# Patient Record
Sex: Female | Born: 1980 | Race: Black or African American | Hispanic: No | Marital: Married | Smoking: Never smoker
Health system: Southern US, Community
[De-identification: ages and names within clinical notes are randomized; demographics above are authoritative.]

## PROBLEM LIST (undated history)

## (undated) DIAGNOSIS — I471 Supraventricular tachycardia, unspecified: Secondary | ICD-10-CM

## (undated) DIAGNOSIS — G43909 Migraine, unspecified, not intractable, without status migrainosus: Secondary | ICD-10-CM

## (undated) DIAGNOSIS — E119 Type 2 diabetes mellitus without complications: Secondary | ICD-10-CM

## (undated) DIAGNOSIS — I10 Essential (primary) hypertension: Secondary | ICD-10-CM

## (undated) HISTORY — PX: HERNIA REPAIR: SHX51

## (undated) HISTORY — DX: Supraventricular tachycardia: I47.1

## (undated) HISTORY — DX: Supraventricular tachycardia, unspecified: I47.10

## (undated) HISTORY — PX: TUBAL LIGATION: SHX77

---

## 2015-10-15 ENCOUNTER — Encounter (HOSPITAL_COMMUNITY): Payer: Self-pay | Admitting: *Deleted

## 2015-10-15 DIAGNOSIS — R110 Nausea: Secondary | ICD-10-CM | POA: Diagnosis not present

## 2015-10-15 DIAGNOSIS — I10 Essential (primary) hypertension: Secondary | ICD-10-CM | POA: Diagnosis not present

## 2015-10-15 DIAGNOSIS — E119 Type 2 diabetes mellitus without complications: Secondary | ICD-10-CM | POA: Diagnosis not present

## 2015-10-15 DIAGNOSIS — Z5321 Procedure and treatment not carried out due to patient leaving prior to being seen by health care provider: Secondary | ICD-10-CM | POA: Insufficient documentation

## 2015-10-15 DIAGNOSIS — R109 Unspecified abdominal pain: Secondary | ICD-10-CM | POA: Diagnosis not present

## 2015-10-15 LAB — URINALYSIS, ROUTINE W REFLEX MICROSCOPIC
Glucose, UA: NEGATIVE mg/dL
Ketones, ur: 15 mg/dL — AB
Nitrite: POSITIVE — AB
Protein, ur: 30 mg/dL — AB
Specific Gravity, Urine: 1.022 (ref 1.005–1.030)
pH: 6 (ref 5.0–8.0)

## 2015-10-15 LAB — COMPREHENSIVE METABOLIC PANEL
ALT: 10 U/L — ABNORMAL LOW (ref 14–54)
AST: 13 U/L — ABNORMAL LOW (ref 15–41)
Albumin: 3.9 g/dL (ref 3.5–5.0)
Alkaline Phosphatase: 80 U/L (ref 38–126)
Anion gap: 10 (ref 5–15)
BUN: 6 mg/dL (ref 6–20)
CO2: 25 mmol/L (ref 22–32)
Calcium: 9.2 mg/dL (ref 8.9–10.3)
Chloride: 102 mmol/L (ref 101–111)
Creatinine, Ser: 0.96 mg/dL (ref 0.44–1.00)
GFR calc Af Amer: >60 mL/min (ref >60)
GFR calc non Af Amer: >60 mL/min (ref >60)
Glucose, Bld: 127 mg/dL — ABNORMAL HIGH (ref 65–99)
Potassium: 3.4 mmol/L — ABNORMAL LOW (ref 3.5–5.1)
Sodium: 137 mmol/L (ref 135–145)
Total Bilirubin: 1.1 mg/dL (ref 0.3–1.2)
Total Protein: 7.2 g/dL (ref 6.5–8.1)

## 2015-10-15 LAB — CBC
HCT: 38.3 % (ref 36.0–46.0)
Hemoglobin: 12.4 g/dL (ref 12.0–15.0)
MCH: 27.9 pg (ref 26.0–34.0)
MCHC: 32.4 g/dL (ref 30.0–36.0)
MCV: 86.3 fL (ref 78.0–100.0)
Platelets: 227 10*3/uL (ref 150–400)
RBC: 4.44 MIL/uL (ref 3.87–5.11)
RDW: 12.4 % (ref 11.5–15.5)
WBC: 10.2 10*3/uL (ref 4.0–10.5)

## 2015-10-15 LAB — URINE MICROSCOPIC-ADD ON

## 2015-10-15 LAB — LIPASE, BLOOD: Lipase: 20 U/L (ref 11–51)

## 2015-10-15 LAB — POC URINE PREG, ED: Preg Test, Ur: NEGATIVE

## 2015-10-15 MED ORDER — OXYCODONE-ACETAMINOPHEN 5-325 MG PO TABS
1.0000 | ORAL_TABLET | Freq: Once | ORAL | Status: AC
  Administered 2015-10-15: 1 via ORAL

## 2015-10-15 MED ORDER — ONDANSETRON 4 MG PO TBDP
4.0000 mg | ORAL_TABLET | Freq: Once | ORAL | Status: AC | PRN
  Administered 2015-10-15: 4 mg via ORAL

## 2015-10-15 MED ORDER — ONDANSETRON 4 MG PO TBDP
ORAL_TABLET | ORAL | Status: AC
  Filled 2015-10-15: qty 1

## 2015-10-15 MED ORDER — OXYCODONE-ACETAMINOPHEN 5-325 MG PO TABS
ORAL_TABLET | ORAL | Status: AC
  Filled 2015-10-15: qty 1

## 2015-10-15 NOTE — ED Triage Notes (Signed)
Pt c/o right flank pain, abdominal pain, n/v for the past couple of days. Pt states movement increases flank pain.

## 2015-10-16 ENCOUNTER — Encounter (HOSPITAL_COMMUNITY): Payer: Self-pay | Admitting: Emergency Medicine

## 2015-10-16 ENCOUNTER — Emergency Department (HOSPITAL_COMMUNITY)
Admission: EM | Admit: 2015-10-16 | Discharge: 2015-10-16 | Disposition: A | Discharge status: Home / Self Care | Payer: Medicaid Other | Attending: Emergency Medicine | Admitting: Emergency Medicine

## 2015-10-16 ENCOUNTER — Emergency Department (HOSPITAL_COMMUNITY)
Admission: EM | Admit: 2015-10-16 | Discharge: 2015-10-17 | Disposition: A | Discharge status: Home / Self Care | Payer: Medicaid Other | Attending: Emergency Medicine | Admitting: Emergency Medicine

## 2015-10-16 DIAGNOSIS — E119 Type 2 diabetes mellitus without complications: Secondary | ICD-10-CM | POA: Diagnosis not present

## 2015-10-16 DIAGNOSIS — I10 Essential (primary) hypertension: Secondary | ICD-10-CM | POA: Insufficient documentation

## 2015-10-16 DIAGNOSIS — N390 Urinary tract infection, site not specified: Secondary | ICD-10-CM | POA: Insufficient documentation

## 2015-10-16 DIAGNOSIS — R1031 Right lower quadrant pain: Secondary | ICD-10-CM | POA: Diagnosis present

## 2015-10-16 HISTORY — DX: Type 2 diabetes mellitus without complications: E11.9

## 2015-10-16 HISTORY — DX: Essential (primary) hypertension: I10

## 2015-10-16 LAB — URINE MICROSCOPIC-ADD ON

## 2015-10-16 LAB — URINALYSIS, ROUTINE W REFLEX MICROSCOPIC
Glucose, UA: NEGATIVE mg/dL
Ketones, ur: 40 mg/dL — AB
Nitrite: POSITIVE — AB
Protein, ur: NEGATIVE mg/dL
Specific Gravity, Urine: 1.023 (ref 1.005–1.030)
pH: 6 (ref 5.0–8.0)

## 2015-10-16 LAB — CBC WITH DIFFERENTIAL/PLATELET
Basophils Absolute: 0 10*3/uL (ref 0.0–0.1)
Basophils Relative: 0 %
Eosinophils Absolute: 0.1 10*3/uL (ref 0.0–0.7)
Eosinophils Relative: 1 %
HCT: 38.1 % (ref 36.0–46.0)
Hemoglobin: 12.2 g/dL (ref 12.0–15.0)
Lymphocytes Relative: 31 %
Lymphs Abs: 2.4 10*3/uL (ref 0.7–4.0)
MCH: 27.9 pg (ref 26.0–34.0)
MCHC: 32 g/dL (ref 30.0–36.0)
MCV: 87.2 fL (ref 78.0–100.0)
Monocytes Absolute: 1 10*3/uL (ref 0.1–1.0)
Monocytes Relative: 13 %
Neutro Abs: 4.2 10*3/uL (ref 1.7–7.7)
Neutrophils Relative %: 55 %
Platelets: 251 10*3/uL (ref 150–400)
RBC: 4.37 MIL/uL (ref 3.87–5.11)
RDW: 12.2 % (ref 11.5–15.5)
WBC: 7.7 10*3/uL (ref 4.0–10.5)

## 2015-10-16 LAB — BASIC METABOLIC PANEL
Anion gap: 10 (ref 5–15)
BUN: 7 mg/dL (ref 6–20)
CO2: 27 mmol/L (ref 22–32)
Calcium: 9 mg/dL (ref 8.9–10.3)
Chloride: 102 mmol/L (ref 101–111)
Creatinine, Ser: 0.86 mg/dL (ref 0.44–1.00)
GFR calc Af Amer: >60 mL/min (ref >60)
GFR calc non Af Amer: >60 mL/min (ref >60)
Glucose, Bld: 107 mg/dL — ABNORMAL HIGH (ref 65–99)
Potassium: 3.5 mmol/L (ref 3.5–5.1)
Sodium: 139 mmol/L (ref 135–145)

## 2015-10-16 LAB — POC URINE PREG, ED: Preg Test, Ur: NEGATIVE

## 2015-10-16 MED ORDER — ONDANSETRON HCL 4 MG/2ML IJ SOLN: 4.0000 mg | Freq: Once | INTRAMUSCULAR | Status: DC

## 2015-10-16 MED ORDER — DEXTROSE 5 % IV SOLN: 1.0000 g | Freq: Once | INTRAVENOUS | Status: DC

## 2015-10-16 MED ORDER — SODIUM CHLORIDE 0.9 % IV BOLUS (SEPSIS): 1000.0000 mL | Freq: Once | INTRAVENOUS | Status: DC

## 2015-10-16 MED ORDER — MORPHINE SULFATE (PF) 4 MG/ML IV SOLN: 4.0000 mg | Freq: Once | INTRAVENOUS | Status: DC

## 2015-10-16 NOTE — ED Triage Notes (Signed)
Pt. reports right lower back pain with concentrated " orange" urine onset 3 days ago , denies dysuria or fever , pt. added headache onset today .

## 2015-10-16 NOTE — ED Provider Notes (Signed)
[facility]-EMERGENCY DEPT Provider Note   CSN: [id number] Arrival date & time: 10/16/15  2039     History   Chief Complaint Chief Complaint  Patient presents with  . Back Pain  . Headache    HPI Paula SeltzerHillary Robertson is a 35 y.o. female.  HPI   35 year old female with history of diabetes and hypertension presenting with complaint of flank pain and low back pain. Patient report for the past 5 days she has had pain to her right flank and low back. Pain initially started in her right lower abdomen but now traveled towards her low back area and she described pain as a achy sensation, persistent, nothing seems to make it better or worse. She endorse nausea, occasional vomiting and decrease in appetite. She endorse chills without fever. She endorses having mild frontal headache with body aches. She has been taking Tylenol with some improvement. Her last menstrual period was last month. She noticed that her urinalysis is darker than usual. She denies active fever, neck stiffness, chest pain, shortness of breath, productive cough, dysuria, hematuria, vaginal bleeding, vaginal discharge, or rash. Denies any pain with sexual activities and denies any change in sexual partner. No prior history of STD.  Past Medical History:  Diagnosis Date  . Diabetes mellitus without complication (HCC)   . Hypertension     There are no active problems to display for this patient.   Past Surgical History:  Procedure Laterality Date  . HERNIA REPAIR    . TUBAL LIGATION      OB History    No data available       Home Medications    Prior to Admission medications   Not on File    Family History No family history on file.  Social History Social History  Substance Use Topics  . Smoking status: Never Smoker  . Smokeless tobacco: Never Used  . Alcohol use Yes     Allergies   Naproxen   Review of Systems Review of Systems  All other systems reviewed and are negative.    Physical Exam Updated  Vital Signs BP 159/87 (BP Location: Right Arm)   Pulse 80   Temp 98.4 F (36.9 C) (Oral)   Resp 18   LMP 09/24/2015   SpO2 100%   Physical Exam  Constitutional: She appears well-developed and well-nourished. No distress.  HENT:  Head: Atraumatic.  Eyes: Conjunctivae are normal.  Neck: Neck supple.  Cardiovascular: Normal rate and regular rhythm.   Pulmonary/Chest: Effort normal and breath sounds normal. No respiratory distress. She has no wheezes. She exhibits no tenderness.  Abdominal: Soft. She exhibits no distension and no mass. There is no tenderness. There is no rebound and no guarding.  Genitourinary:  Genitourinary Comments: Tenderness to lower back and right flank on percussion  Neurological: She is alert.  Skin: No rash noted.  Psychiatric: She has a normal mood and affect.  Nursing note and vitals reviewed.    ED Treatments / Results  Labs (all labs ordered are listed, but only abnormal results are displayed) Labs Reviewed  URINALYSIS, ROUTINE W REFLEX MICROSCOPIC (NOT AT [facility]) - Abnormal; Notable for the following:       Result Value   Color, Urine AMBER (*)    APPearance CLOUDY (*)    Hgb urine dipstick MODERATE (*)    Bilirubin Urine SMALL (*)    Ketones, ur 40 (*)    Nitrite POSITIVE (*)    Leukocytes, UA LARGE (*)    All  other components within normal limits  BASIC METABOLIC PANEL - Abnormal; Notable for the following:    Glucose, Bld 107 (*)    All other components within normal limits  URINE MICROSCOPIC-ADD ON - Abnormal; Notable for the following:    Squamous Epithelial / LPF 0-5 (*)    Bacteria, UA MANY (*)    All other components within normal limits  CBC WITH DIFFERENTIAL/PLATELET  POC URINE PREG, ED    EKG  EKG Interpretation None       Radiology No results found.  Procedures Procedures (including critical care time)  Medications Ordered in ED Medications  cefTRIAXone (ROCEPHIN) 1 g in dextrose 5 % 50 mL IVPB (not administered)    sodium chloride 0.9 % bolus 1,000 mL (not administered)  morphine 4 MG/ML injection 4 mg (not administered)  ondansetron (ZOFRAN) injection 4 mg (not administered)     Initial Impression / Assessment and Plan / ED Course  I have reviewed the triage vital signs and the nursing notes.  Pertinent labs & imaging results that were available during my care of the patient were reviewed by me and considered in my medical decision making (see chart for details).  Clinical Course    BP 137/95 (BP Location: Right Arm)   Pulse 86   Temp 99 F (37.2 C) (Oral)   Resp 18   LMP 09/24/2015   SpO2 100%    Final Clinical Impressions(s) / ED Diagnoses   Final diagnoses:  UTI (lower urinary tract infection)    New Prescriptions New Prescriptions   CEPHALEXIN (KEFLEX) 500 MG CAPSULE    Take 1 capsule (500 mg total) by mouth 3 (three) times daily.   PROMETHAZINE (PHENERGAN) 25 MG TABLET    Take 1 tablet (25 mg total) by mouth every 6 (six) hours as needed for nausea.   11:30 PM Patient here with right flank and low back pain without any complaints of dysuria. She reports pain initially started in her low abdomen but has since resolved. She has a benign abdomen on exam without any reproducible pain to her abdomen but does have pain to her lower back. She denies any new sexual partner. She denies prior history of kidney stone. She is afebrile, vital signs stable. UA shows signs concerning for urinary tract infection. I offer patient a pelvic examination to rule out STD however patient declined. Given no abdominal pain and normal white count I have low suspicion for appendicitis causing her symptoms. She appears comfortable does not appears to be a patient with kidney stone. We'll start Rocephin and IVF.    12:14 AM Pt did not have any strong vaginal odor and she denies having vaginal discomfort.  Clue cells are present but I do not think she is having BV.  She will be discharge with keflex as  treatment for UTI.  Return precaution discussed.     Fayrene Helper, PA-C 10/17/15 0016    Gilda Crease, MD 10/17/15 [phone]

## 2015-10-16 NOTE — ED Notes (Signed)
EDP at bedside  

## 2015-10-17 MED ORDER — PROMETHAZINE HCL 25 MG PO TABS: 25.0000 mg | ORAL_TABLET | Freq: Four times a day (QID) | ORAL | 0 refills | Status: DC | PRN

## 2015-10-17 MED ORDER — LIDOCAINE HCL (PF) 1 % IJ SOLN
INTRAMUSCULAR | Status: AC
  Administered 2015-10-17: 2.1 mL
  Filled 2015-10-17: qty 5

## 2015-10-17 MED ORDER — CEPHALEXIN 500 MG PO CAPS: 500.0000 mg | ORAL_CAPSULE | Freq: Three times a day (TID) | ORAL | 0 refills | Status: DC

## 2015-10-17 MED ORDER — CEFTRIAXONE SODIUM 1 G IJ SOLR
1.0000 g | Freq: Once | INTRAMUSCULAR | Status: AC
  Administered 2015-10-17: 1 g via INTRAMUSCULAR
  Filled 2015-10-17: qty 10

## 2015-10-17 NOTE — ED Notes (Signed)
Pt departed in NAD, refused use of wheelchair.  

## 2015-10-18 LAB — URINE CULTURE: Culture: >=100000 — AB

## 2015-10-19 ENCOUNTER — Telehealth (HOSPITAL_BASED_OUTPATIENT_CLINIC_OR_DEPARTMENT_OTHER): Payer: Self-pay

## 2015-10-19 NOTE — Telephone Encounter (Signed)
Post ED Visit - Positive Culture Follow-up  Culture report reviewed by antimicrobial stewardship pharmacist:  []  Paula Robertson, Pharm.D. []  Paula Robertson, Pharm.D., BCPS [x]  Paula Robertson, Pharm.D. []  Paula Robertson, Pharm.D., BCPS []  Paula Robertson, [address] BCPS, AAHIVP []  Paula Robertson, Pharm.D., BCPS, AAHIVP []  Paula Robertson, Pharm.D. []  Paula Robertson, [address]Pharm[address]  Positive urine culture and no further patient follow-up is required at this time.  Paula Robertson, Paula Robertson 10/19/2015, 8:59 AM

## 2017-05-25 ENCOUNTER — Emergency Department (HOSPITAL_COMMUNITY): Payer: Self-pay

## 2017-05-25 ENCOUNTER — Encounter (HOSPITAL_COMMUNITY): Payer: Self-pay | Admitting: Emergency Medicine

## 2017-05-25 ENCOUNTER — Other Ambulatory Visit: Payer: Self-pay

## 2017-05-25 ENCOUNTER — Emergency Department (HOSPITAL_COMMUNITY)
Admission: EM | Admit: 2017-05-25 | Discharge: 2017-05-25 | Disposition: A | Discharge status: Home / Self Care | Payer: Self-pay | Attending: Emergency Medicine | Admitting: Emergency Medicine

## 2017-05-25 DIAGNOSIS — E119 Type 2 diabetes mellitus without complications: Secondary | ICD-10-CM | POA: Insufficient documentation

## 2017-05-25 DIAGNOSIS — B9789 Other viral agents as the cause of diseases classified elsewhere: Secondary | ICD-10-CM

## 2017-05-25 DIAGNOSIS — I10 Essential (primary) hypertension: Secondary | ICD-10-CM | POA: Insufficient documentation

## 2017-05-25 DIAGNOSIS — J069 Acute upper respiratory infection, unspecified: Secondary | ICD-10-CM | POA: Insufficient documentation

## 2017-05-25 DIAGNOSIS — Z79899 Other long term (current) drug therapy: Secondary | ICD-10-CM | POA: Insufficient documentation

## 2017-05-25 MED ORDER — GUAIFENESIN-CODEINE 100-10 MG/5ML PO SOLN: 5.0000 mL | Freq: Three times a day (TID) | ORAL | 0 refills | Status: DC | PRN

## 2017-05-25 MED ORDER — FLUTICASONE PROPIONATE 50 MCG/ACT NA SUSP: 1.0000 | Freq: Every day | NASAL | 2 refills | Status: DC

## 2017-05-25 MED ORDER — ACETAMINOPHEN 325 MG PO TABS
650.0000 mg | ORAL_TABLET | Freq: Once | ORAL | Status: AC
  Administered 2017-05-25: 650 mg via ORAL
  Filled 2017-05-25: qty 2

## 2017-05-25 MED ORDER — ALBUTEROL SULFATE HFA 108 (90 BASE) MCG/ACT IN AERS
1.0000 | INHALATION_SPRAY | Freq: Once | RESPIRATORY_TRACT | Status: AC
  Administered 2017-05-25: 1 via RESPIRATORY_TRACT
  Filled 2017-05-25: qty 6.7

## 2017-05-25 MED ORDER — BENZONATATE 100 MG PO CAPS: 200.0000 mg | ORAL_CAPSULE | Freq: Three times a day (TID) | ORAL | 0 refills | Status: DC

## 2017-05-25 MED ORDER — DEXAMETHASONE SODIUM PHOSPHATE 10 MG/ML IJ SOLN
10.0000 mg | Freq: Once | INTRAMUSCULAR | Status: AC
  Administered 2017-05-25: 10 mg via INTRAMUSCULAR
  Filled 2017-05-25: qty 1

## 2017-05-25 MED ORDER — IPRATROPIUM-ALBUTEROL 0.5-2.5 (3) MG/3ML IN SOLN
3.0000 mL | Freq: Once | RESPIRATORY_TRACT | Status: AC
  Administered 2017-05-25: 3 mL via RESPIRATORY_TRACT
  Filled 2017-05-25: qty 3

## 2017-05-25 NOTE — ED Notes (Signed)
Pt reports known hx of HTN, is currently on medication for it. States has not taken today.

## 2017-05-25 NOTE — ED Triage Notes (Signed)
Pt to ER for one week productive cough with nasal congestion. VSS. Pt in NAD. Coughing at this time, no production.

## 2017-05-25 NOTE — ED Provider Notes (Signed)
[facility] EMERGENCY DEPARTMENT Provider Note   CSN: [id number] Arrival date & time: 05/25/17  8295     History   Chief Complaint Chief Complaint  Patient presents with  . URI    HPI Paula Robertson is a 37 y.o. female with a past medical history of hypertension, diet-controlled diabetes, who presents to ED for evaluation of one-week history of cough productive with clear mucus, nasal congestion, generalized body aches.  She has tried over-the-counter antihistamines and antitussives with no improvement in her symptoms.  She has also ran out of her albuterol inhaler but denies any wheezing.  No sick contacts with similar symptoms.  Did not receive influenza vaccine this year.  Denies any fevers, nausea, shortness of breath.  HPI  Past Medical History:  Diagnosis Date  . Diabetes mellitus without complication (HCC)   . Hypertension     There are no active problems to display for this patient.   Past Surgical History:  Procedure Laterality Date  . HERNIA REPAIR    . TUBAL LIGATION       OB History   None      Home Medications    Prior to Admission medications   Medication Sig Start Date End Date Taking? Authorizing Provider  benzonatate (TESSALON) 100 MG capsule Take 2 capsules (200 mg total) by mouth every 8 (eight) hours. 05/25/17   Abdalla Naramore, PA-C  cephALEXin (KEFLEX) 500 MG capsule Take 1 capsule (500 mg total) by mouth 3 (three) times daily. 10/17/15   Fayrene Helper, PA-C  fluticasone (FLONASE) 50 MCG/ACT nasal spray Place 1 spray into both nostrils daily. 05/25/17   Lily Velasquez, PA-C  guaiFENesin-codeine 100-10 MG/5ML syrup Take 5 mLs by mouth 3 (three) times daily as needed for cough. 05/25/17   Dawit Tankard, PA-C  promethazine (PHENERGAN) 25 MG tablet Take 1 tablet (25 mg total) by mouth every 6 (six) hours as needed for nausea. 10/17/15   Fayrene Helper, PA-C    Family History History reviewed. No pertinent family history.  Social  History Social History   Tobacco Use  . Smoking status: Never Smoker  . Smokeless tobacco: Never Used  Substance Use Topics  . Alcohol use: Yes  . Drug use: No     Allergies   Naproxen   Review of Systems Review of Systems  Constitutional: Negative for chills and fever.  HENT: Positive for congestion. Negative for ear pain, facial swelling, sinus pressure, sinus pain and sore throat.   Respiratory: Positive for cough.   Gastrointestinal: Negative for nausea.     Physical Exam Updated Vital Signs BP (!) 176/98 (BP Location: Right Arm)   Pulse 98   Temp 98.5 F (36.9 C) (Oral)   Resp 18   LMP 05/23/2017   SpO2 98%   Physical Exam  Constitutional: She appears well-developed and well-nourished. No distress.  Nontoxic appearing and in no acute distress.  Hacking cough noted on examination.  HENT:  Head: Normocephalic and atraumatic.  Eyes: Conjunctivae and EOM are normal. No scleral icterus.  Neck: Normal range of motion.  Cardiovascular: Normal rate, regular rhythm and normal heart sounds.  Pulmonary/Chest: Effort normal and breath sounds normal. No respiratory distress.  Coarse breath sounds noted in bilateral lung fields.  No wheezing noted.  Neurological: She is alert.  Skin: No rash noted. She is not diaphoretic.  Psychiatric: She has a normal mood and affect.  Nursing note and vitals reviewed.    ED Treatments / Results  Labs (all  labs ordered are listed, but only abnormal results are displayed) Labs Reviewed - No data to display  EKG None  Radiology Dg Chest 2 View  Result Date: 05/25/2017 CLINICAL DATA:  Productive cough for the past week. Nausea and vomiting. EXAM: CHEST - 2 VIEW COMPARISON:  None. FINDINGS: Normal cardiac silhouette and mediastinal contours. Minimal perihilar interstitial thickening and thickening along the bilateral major fissures. No focal airspace opacities. No pleural effusion pneumothorax. No evidence of edema. No acute osseus  abnormalities. Surgical mesh overlies the midline of the upper abdomen. IMPRESSION: Findings suggestive of mild airways disease / bronchitis. No focal airspace opacities to suggest pneumonia. Electronically Signed   By: Simonne ComeJohn  Watts M.D.   On: 05/25/2017 08:17    Procedures Procedures (including critical care time)  Medications Ordered in ED Medications  ipratropium-albuterol (DUONEB) 0.5-2.5 (3) MG/3ML nebulizer solution 3 mL (3 mLs Nebulization Given 05/25/17 1103)  dexamethasone (DECADRON) injection 10 mg (10 mg Intramuscular Given 05/25/17 1104)  acetaminophen (TYLENOL) tablet 650 mg (650 mg Oral Given 05/25/17 1102)  albuterol (PROVENTIL HFA;VENTOLIN HFA) 108 (90 Base) MCG/ACT inhaler 1 puff (1 puff Inhalation Given 05/25/17 1106)     Initial Impression / Assessment and Plan / ED Course  I have reviewed the triage vital signs and the nursing notes.  Pertinent labs & imaging results that were available during my care of the patient were reviewed by me and considered in my medical decision making (see chart for details).     Patient presents to ED for evaluation of productive cough, nasal congestion for the past week.  She does have a history of asthma and ran out of her albuterol inhaler.  She denies any wheezing or shortness of breath.  Chest x-ray showing reactive airway disease and possible bronchitis.  No signs of pneumonia.  On physical exam she is coarse breath sounds in bilateral lung fields.  She is no wheezing or signs of respiratory distress noted.  There is a dry hacking cough noted.  Patient request breathing treatment which I believe would be beneficial to her due to her history of asthma.  We will also give steroid shot, Tylenol for body aches.  Will give refill for albuterol here as well as antitussives and nasal spray to take home.  Advised to return for any severe worsening symptoms. [place] narcotic database reviewed with no discrepancies.  Portions of this note were generated  with Scientist, clinical (histocompatibility and immunogenetics)Dragon dictation software. Dictation errors may occur despite best attempts at proofreading.  Final Clinical Impressions(s) / ED Diagnoses   Final diagnoses:  Viral URI with cough    ED Discharge Orders        Ordered    benzonatate (TESSALON) 100 MG capsule  Every 8 hours     05/25/17 1035    guaiFENesin-codeine 100-10 MG/5ML syrup  3 times daily PRN     05/25/17 1035    fluticasone (FLONASE) 50 MCG/ACT nasal spray  Daily     05/25/17 1035       Dietrich PatesKhatri, Braidan Ricciardi, PA-C 05/25/17 1117    Cathren LaineSteinl, Kevin, MD 05/26/17 1251

## 2017-10-18 ENCOUNTER — Other Ambulatory Visit: Payer: Self-pay

## 2017-10-18 ENCOUNTER — Emergency Department (HOSPITAL_BASED_OUTPATIENT_CLINIC_OR_DEPARTMENT_OTHER)
Admission: EM | Admit: 2017-10-18 | Discharge: 2017-10-18 | Disposition: A | Discharge status: Home / Self Care | Payer: Self-pay | Attending: Emergency Medicine | Admitting: Emergency Medicine

## 2017-10-18 ENCOUNTER — Encounter (HOSPITAL_BASED_OUTPATIENT_CLINIC_OR_DEPARTMENT_OTHER): Payer: Self-pay | Admitting: *Deleted

## 2017-10-18 ENCOUNTER — Emergency Department (HOSPITAL_BASED_OUTPATIENT_CLINIC_OR_DEPARTMENT_OTHER): Payer: Self-pay

## 2017-10-18 DIAGNOSIS — I10 Essential (primary) hypertension: Secondary | ICD-10-CM | POA: Insufficient documentation

## 2017-10-18 DIAGNOSIS — E119 Type 2 diabetes mellitus without complications: Secondary | ICD-10-CM | POA: Insufficient documentation

## 2017-10-18 DIAGNOSIS — Z79899 Other long term (current) drug therapy: Secondary | ICD-10-CM | POA: Insufficient documentation

## 2017-10-18 DIAGNOSIS — H538 Other visual disturbances: Secondary | ICD-10-CM | POA: Insufficient documentation

## 2017-10-18 DIAGNOSIS — R519 Headache, unspecified: Secondary | ICD-10-CM

## 2017-10-18 DIAGNOSIS — R51 Headache: Secondary | ICD-10-CM | POA: Insufficient documentation

## 2017-10-18 MED ORDER — SODIUM CHLORIDE 0.9 % IV SOLN: INTRAVENOUS | Status: DC

## 2017-10-18 MED ORDER — DIPHENHYDRAMINE HCL 50 MG/ML IJ SOLN
12.5000 mg | Freq: Once | INTRAMUSCULAR | Status: AC
  Administered 2017-10-18: 12.5 mg via INTRAVENOUS
  Filled 2017-10-18: qty 1

## 2017-10-18 MED ORDER — SODIUM CHLORIDE 0.9 % IV BOLUS
1000.0000 mL | Freq: Once | INTRAVENOUS | Status: AC
  Administered 2017-10-18: 1000 mL via INTRAVENOUS

## 2017-10-18 MED ORDER — ACETAMINOPHEN 325 MG PO TABS
650.0000 mg | ORAL_TABLET | Freq: Once | ORAL | Status: AC
  Administered 2017-10-18: 650 mg via ORAL
  Filled 2017-10-18: qty 2

## 2017-10-18 MED ORDER — DEXAMETHASONE SODIUM PHOSPHATE 10 MG/ML IJ SOLN
10.0000 mg | Freq: Once | INTRAMUSCULAR | Status: AC
  Administered 2017-10-18: 10 mg via INTRAVENOUS
  Filled 2017-10-18: qty 1

## 2017-10-18 MED ORDER — PROCHLORPERAZINE EDISYLATE 10 MG/2ML IJ SOLN
10.0000 mg | Freq: Once | INTRAMUSCULAR | Status: AC
  Administered 2017-10-18: 10 mg via INTRAVENOUS
  Filled 2017-10-18: qty 2

## 2017-10-18 MED ORDER — KETOROLAC TROMETHAMINE 15 MG/ML IJ SOLN
15.0000 mg | Freq: Once | INTRAMUSCULAR | Status: AC
  Administered 2017-10-18: 15 mg via INTRAVENOUS
  Filled 2017-10-18: qty 1

## 2017-10-18 NOTE — Discharge Instructions (Addendum)
You were seen in the emergency department today for a headache.  The CT scan of your head was normal.  Please take Tylenol and/or ibuprofen per over-the-counter dosing at home should your headaches return.  We would like you to follow-up with your primary care provider to discuss her management and for reevaluation.  Return to the ER for new or worsening symptoms including but not limited to worsening pain, change in your vision, numbness, weakness, dizziness, passing out, or any other concerns that you may have.

## 2017-10-18 NOTE — ED Notes (Signed)
Patient transported to CT 

## 2017-10-18 NOTE — ED Provider Notes (Signed)
[facility] EMERGENCY DEPARTMENT Provider Note   CSN: [id number] Arrival date & time: 10/18/17  1025     History   Chief Complaint Chief Complaint  Patient presents with  . Migraine    HPI Paula Robertson is a 37 y.o. female with a hx of DM and HTN who presents to the ED with complaint of headache x 2 days. Pain is located in frontal/bilateral temporal area, throbbing in nature, and an 8/10 in severity. Pain has fairly gradual onset with steady progression, has been waxing/waning since onset. Pain is worse with bright lights, no alleviating factors, has tried OTC tylenol/ibuprofen without relief. Reports associated blurry vision today as well as fluctuating BP readings at home with highest being in the 160s systolic. She reports hx of similar headache which required migraine cocktail in the ER, but has not had this blurry vision with it before. Denies numbness, weakness, dizziness, nausea, vomiting, syncope, or head trauma. Denies fever, chills, or neck stiffness. She denies chance of pregnancy as she is s/p tubal ligation and is declining pregnancy testing today.   HPI  Past Medical History:  Diagnosis Date  . Diabetes mellitus without complication (HCC)   . Hypertension     There are no active problems to display for this patient.   Past Surgical History:  Procedure Laterality Date  . HERNIA REPAIR    . TUBAL LIGATION       OB History   None      Home Medications    Prior to Admission medications   Medication Sig Start Date End Date Taking? Authorizing Provider  benazepril (LOTENSIN) 10 MG tablet Take 10 mg by mouth daily.   Yes [provider]  hydrochlorothiazide (HYDRODIURIL) 25 MG tablet Take 25 mg by mouth daily.   Yes [provider]    Family History History reviewed. No pertinent family history.  Social History Social History   Tobacco Use  . Smoking status: Never Smoker  . Smokeless tobacco: Never Used  Substance Use  Topics  . Alcohol use: Yes  . Drug use: No     Allergies   Naproxen   Review of Systems Review of Systems  Constitutional: Negative for chills and fever.  Eyes: Positive for photophobia and visual disturbance.  Gastrointestinal: Negative for nausea and vomiting.  Musculoskeletal: Negative for neck stiffness.  Neurological: Positive for headaches. Negative for dizziness, syncope, facial asymmetry, speech difficulty, weakness, light-headedness and numbness.  All other systems reviewed and are negative.    Physical Exam Updated Vital Signs BP (!) 142/93 (BP Location: Left Arm)   Pulse 73   Temp 99.1 F (37.3 C) (Oral)   Resp 16   Ht 5\' 7"  (1.702 m)   Wt 76.2 kg   LMP 09/17/2017   SpO2 100%   BMI 26.31 kg/m   Physical Exam  Constitutional: She appears well-developed and well-nourished.  Non-toxic appearance. No distress.  HENT:  Head: Normocephalic and atraumatic.  Right Ear: Tympanic membrane normal.  Left Ear: Tympanic membrane normal.  Mouth/Throat: Uvula is midline.  Eyes: Pupils are equal, round, and reactive to light. Conjunctivae and EOM are normal. Right eye exhibits no discharge. Left eye exhibits no discharge.  No proptosis. No temporal artery area tenderness.   Neck: Normal range of motion. Neck supple. No neck rigidity. No edema and no erythema present.  Cardiovascular: Normal rate and regular rhythm.  Pulmonary/Chest: Effort normal and breath sounds normal. No respiratory distress. She has no wheezes. She has no rhonchi.  She has no rales.  Respiration even and unlabored  Abdominal: Soft. She exhibits no distension. There is no tenderness.  Neurological: She is alert.  Alert. Clear speech. No facial droop. CNIII-XII grossly intact. Bilateral upper and lower extremities' sensation grossly intact. 5/5 symmetric strength with grip strength and with plantar and dorsi flexion bilaterally. Normal finger to nose bilaterally. Negative pronator drift. Negative Romberg  sign. Gait is steady and intact.    Skin: Skin is warm and dry. No rash noted.  Psychiatric: She has a normal mood and affect. Her behavior is normal.  Nursing note and vitals reviewed.    ED Treatments / Results  Labs (all labs ordered are listed, but only abnormal results are displayed) Labs Reviewed - No data to display  EKG None  Radiology Ct Head Wo Contrast  Result Date: 10/18/2017 CLINICAL DATA:  Bilateral temporal migraines with blurred vision for 2 days EXAM: CT HEAD WITHOUT CONTRAST TECHNIQUE: Contiguous axial images were obtained from the base of the skull through the vertex without intravenous contrast. COMPARISON:  None. FINDINGS: Brain: No evidence of acute infarction, hemorrhage, hydrocephalus, extra-axial collection or mass lesion/mass effect. Vascular: No hyperdense vessel or unexpected calcification. Skull: No osseous abnormality. Sinuses/Orbits: Visualized paranasal sinuses are clear. Visualized mastoid sinuses are clear. Visualized orbits demonstrate no focal abnormality. Other: None IMPRESSION: No acute intracranial pathology. Electronically Signed   By: Elige Ko   On: 10/18/2017 11:27    Procedures Procedures (including critical care time)  Medications Ordered in ED Medications  sodium chloride 0.9 % bolus 1,000 mL (0 mLs Intravenous Stopped 10/18/17 1242)    And  0.9 %  sodium chloride infusion (has no administration in time range)  ketorolac (TORADOL) 15 MG/ML injection 15 mg (15 mg Intravenous Given 10/18/17 1148)  prochlorperazine (COMPAZINE) injection 10 mg (10 mg Intravenous Given 10/18/17 1148)  diphenhydrAMINE (BENADRYL) injection 12.5 mg (12.5 mg Intravenous Given 10/18/17 1148)  dexamethasone (DECADRON) injection 10 mg (10 mg Intravenous Given 10/18/17 1240)  acetaminophen (TYLENOL) tablet 650 mg (650 mg Oral Given 10/18/17 1241)     Initial Impression / Assessment and Plan / ED Course  I have reviewed the triage vital signs and the nursing  notes.  Pertinent labs & imaging results that were available during my care of the patient were reviewed by me and considered in my medical decision making (see chart for details).    Patient presents with complaint of headache. Patient is nontoxic appearing, vitals WNL other than elevated BP, doubt HTN emergency, patient aware of need for recheck. Patient has hx of similar headaches, gradual onset with steady progression in severity, she has had some mild blurry vision which is not typical for her headaches, CT head obtained and negative for acute process- doubt SAH, ICH, ischemic CVA, dural venous sinus thrombosis, acute glaucoma, giant cell arteritis, mass, or meningitis. Pt is afebrile with no focal neuro deficits, dizziness, proptosis, or nuchal rigidity. Patient treated for headache with migraine cocktail with improvement, requesting discharge home. I discussed treatment plan, need for PCP follow-up, and return precautions with the patient. Provided opportunity for questions, patient confirmed understanding and is in agreement with plan.   Final Clinical Impressions(s) / ED Diagnoses   Final diagnoses:  Acute nonintractable headache, unspecified headache type    ED Discharge Orders    None       Cherly Anderson, PA-C 10/18/17 1338    Jacalyn Lefevre, MD 10/18/17 1356

## 2017-10-18 NOTE — ED Triage Notes (Signed)
Pt c/o migraine x 2 days.  ?

## 2017-12-30 ENCOUNTER — Encounter (HOSPITAL_COMMUNITY): Payer: Self-pay

## 2017-12-30 ENCOUNTER — Other Ambulatory Visit: Payer: Self-pay

## 2017-12-30 ENCOUNTER — Emergency Department (HOSPITAL_COMMUNITY)
Admission: EM | Admit: 2017-12-30 | Discharge: 2017-12-30 | Disposition: A | Discharge status: Home / Self Care | Payer: Medicaid Other | Attending: Emergency Medicine | Admitting: Emergency Medicine

## 2017-12-30 DIAGNOSIS — L0231 Cutaneous abscess of buttock: Secondary | ICD-10-CM | POA: Insufficient documentation

## 2017-12-30 DIAGNOSIS — Z79899 Other long term (current) drug therapy: Secondary | ICD-10-CM | POA: Insufficient documentation

## 2017-12-30 DIAGNOSIS — E119 Type 2 diabetes mellitus without complications: Secondary | ICD-10-CM | POA: Insufficient documentation

## 2017-12-30 DIAGNOSIS — I10 Essential (primary) hypertension: Secondary | ICD-10-CM | POA: Insufficient documentation

## 2017-12-30 MED ORDER — LIDOCAINE-EPINEPHRINE (PF) 2 %-1:200000 IJ SOLN
10.0000 mL | Freq: Once | INTRAMUSCULAR | Status: AC
  Administered 2017-12-30: 10 mL
  Filled 2017-12-30: qty 10

## 2017-12-30 MED ORDER — HYDROCODONE-ACETAMINOPHEN 5-325 MG PO TABS
1.0000 | ORAL_TABLET | Freq: Once | ORAL | Status: AC
  Administered 2017-12-30: 1 via ORAL
  Filled 2017-12-30: qty 1

## 2017-12-30 NOTE — ED Notes (Signed)
ED Provider at bedside. 

## 2017-12-30 NOTE — ED Notes (Signed)
Patient verbalizes understanding of discharge instructions. Opportunity for questioning and answers were provided. Armband removed by staff, pt discharged from ED ambulatory.   

## 2017-12-30 NOTE — Discharge Instructions (Addendum)
Soak in tub of warm water or Epson salt for 15 to 20 minutes 2-3 times a day for the next 3 to 4 days.  Or you could do warm compresses to the area.  Avoid shaving or using thong underwear. Use Tylenol for pain

## 2017-12-30 NOTE — ED Provider Notes (Signed)
[facility] EMERGENCY DEPARTMENT Provider Note   CSN: [id number] Arrival date & time: 12/30/17  1658     History   Chief Complaint Chief Complaint  Patient presents with  . Abscess    HPI Paula Robertson is a 37 y.o. female.  The history is provided by the patient.  Abscess  Location:  Pelvis Pelvic abscess location:  L buttock Size:  2x3cm Abscess quality: fluctuance, induration, painful and redness   Red streaking: no   Duration:  3 days Progression:  Worsening Pain details:    Quality:  Sharp, throbbing and shooting   Severity:  Severe   Duration:  3 days   Timing:  Constant   Progression:  Worsening Chronicity:  New Context: diabetes   Context comment:  Shaves in that area and wears a thong and thinks she got an ingrown hair Relieved by:  Nothing Exacerbated by: walking and sitting. Ineffective treatments:  None tried Associated symptoms: no fever, no nausea and no vomiting   Risk factors: no hx of MRSA and no prior abscess     Past Medical History:  Diagnosis Date  . Diabetes mellitus without complication (HCC)   . Hypertension     There are no active problems to display for this patient.   Past Surgical History:  Procedure Laterality Date  . HERNIA REPAIR    . TUBAL LIGATION       OB History   None      Home Medications    Prior to Admission medications   Medication Sig Start Date End Date Taking? Authorizing Provider  benazepril (LOTENSIN) 10 MG tablet Take 10 mg by mouth daily.    [provider]  hydrochlorothiazide (HYDRODIURIL) 25 MG tablet Take 25 mg by mouth daily.    [provider]    Family History No family history on file.  Social History Social History   Tobacco Use  . Smoking status: Never Smoker  . Smokeless tobacco: Never Used  Substance Use Topics  . Alcohol use: Yes  . Drug use: No     Allergies   Naproxen   Review of Systems Review of Systems  Constitutional:  Negative for fever.  Gastrointestinal: Negative for nausea and vomiting.  All other systems reviewed and are negative.    Physical Exam Updated Vital Signs BP (!) 161/89   Pulse 80   Temp 98.7 F (37.1 C) (Oral)   Resp 16   Ht 5\' 7"  (1.702 m)   Wt 74.8 kg   SpO2 100%   BMI 25.84 kg/m   Physical Exam  Constitutional: She is oriented to person, place, and time. She appears well-developed and well-nourished. No distress.  HENT:  Head: Normocephalic and atraumatic.  Eyes: Pupils are equal, round, and reactive to light.  Cardiovascular: Normal rate.  Pulmonary/Chest: Effort normal.  Abdominal: Soft.  Genitourinary:     Neurological: She is alert and oriented to person, place, and time.  Skin: Skin is warm and dry.  Psychiatric: She has a normal mood and affect. Her behavior is normal.  Nursing note and vitals reviewed.    ED Treatments / Results  Labs (all labs ordered are listed, but only abnormal results are displayed) Labs Reviewed - No data to display  EKG None  Radiology No results found.  Procedures Procedures (including critical care time) INCISION AND DRAINAGE Performed by: Gwyneth Sprout Consent: Verbal consent obtained. Risks and benefits: risks, benefits and alternatives were discussed Type: abscess  Body area: left  buttocks  Anesthesia: local infiltration  Incision was made with a scalpel.  Local anesthetic: lidocaine 2% with epinephrine  Anesthetic total: 3 ml  Complexity: complex Blunt dissection to break up loculations  Drainage: purulent  Drainage amount: 4mL  Packing material: none  Patient tolerance: Patient tolerated the procedure well with no immediate complications.     Medications Ordered in ED Medications  lidocaine-EPINEPHrine (XYLOCAINE W/EPI) 2 %-1:200000 (PF) injection 10 mL (has no administration in time range)  HYDROcodone-acetaminophen (NORCO/VICODIN) 5-325 MG per tablet 1 tablet (has no administration in  time range)     Initial Impression / Assessment and Plan / ED Course  I have reviewed the triage vital signs and the nursing notes.  Pertinent labs & imaging results that were available during my care of the patient were reviewed by me and considered in my medical decision making (see chart for details).     Patient presenting with an uncomplicated left buttocks abscess that does not appear to involve the rectum.  I&D as above with moderate pus drainage.  Patient instructed to no longer shave in that area or wear a thong.  She was given strict return precautions.  There is not significant surrounding cellulitis did not feel she needs an antibiotic at this time but encouraged her to continue warm soaks.  Final Clinical Impressions(s) / ED Diagnoses   Final diagnoses:  Abscess of buttock, left    ED Discharge Orders    None       Gwyneth SproutPlunkett, Conway Fedora, MD 12/30/17 1744

## 2017-12-30 NOTE — ED Triage Notes (Addendum)
Pt endorses abscess between her peroneal area x 3 days. Pt states it is swollen and painful at 10/10. Pt reports hx of abscesses in the past.

## 2018-01-28 ENCOUNTER — Emergency Department (HOSPITAL_BASED_OUTPATIENT_CLINIC_OR_DEPARTMENT_OTHER): Payer: Self-pay

## 2018-01-28 ENCOUNTER — Other Ambulatory Visit: Payer: Self-pay

## 2018-01-28 ENCOUNTER — Encounter (HOSPITAL_BASED_OUTPATIENT_CLINIC_OR_DEPARTMENT_OTHER): Payer: Self-pay

## 2018-01-28 ENCOUNTER — Emergency Department (HOSPITAL_BASED_OUTPATIENT_CLINIC_OR_DEPARTMENT_OTHER)
Admission: EM | Admit: 2018-01-28 | Discharge: 2018-01-28 | Disposition: A | Discharge status: Home / Self Care | Payer: Self-pay | Attending: Emergency Medicine | Admitting: Emergency Medicine

## 2018-01-28 DIAGNOSIS — B9789 Other viral agents as the cause of diseases classified elsewhere: Secondary | ICD-10-CM | POA: Insufficient documentation

## 2018-01-28 DIAGNOSIS — Z79899 Other long term (current) drug therapy: Secondary | ICD-10-CM | POA: Insufficient documentation

## 2018-01-28 DIAGNOSIS — R69 Illness, unspecified: Secondary | ICD-10-CM

## 2018-01-28 DIAGNOSIS — J069 Acute upper respiratory infection, unspecified: Secondary | ICD-10-CM | POA: Insufficient documentation

## 2018-01-28 DIAGNOSIS — I10 Essential (primary) hypertension: Secondary | ICD-10-CM | POA: Insufficient documentation

## 2018-01-28 DIAGNOSIS — E119 Type 2 diabetes mellitus without complications: Secondary | ICD-10-CM | POA: Insufficient documentation

## 2018-01-28 DIAGNOSIS — J111 Influenza due to unidentified influenza virus with other respiratory manifestations: Secondary | ICD-10-CM

## 2018-01-28 MED ORDER — IBUPROFEN 800 MG PO TABS: 800.0000 mg | ORAL_TABLET | Freq: Three times a day (TID) | ORAL | 0 refills | Status: DC | PRN

## 2018-01-28 MED ORDER — ACETAMINOPHEN-CODEINE 120-12 MG/5ML PO SOLN: 10.0000 mL | ORAL | 0 refills | Status: DC | PRN

## 2018-01-28 MED ORDER — GUAIFENESIN ER 1200 MG PO TB12: 1.0000 | ORAL_TABLET | Freq: Two times a day (BID) | ORAL | 0 refills | Status: DC

## 2018-01-28 NOTE — ED Provider Notes (Signed)
[facility] EMERGENCY DEPARTMENT Provider Note   CSN: [id number] Arrival date & time: 01/28/18  1524     History   Chief Complaint Chief Complaint  Patient presents with  . Cough    HPI Paula Robertson is a 37 y.o. female.  HPI Patient presents to the emergency department with 2-day history of cough, body aches, nasal congestion, sore throat, nausea and chills.  Patient states that she did not take any medications prior to arrival for her symptoms.  Patient states that she has been taking penicillin for dental infection.  The patient denies chest pain, shortness of breath, headache,blurred vision, neck pain, fever, weakness, numbness, dizziness, anorexia, edema, abdominal pain, vomiting, diarrhea, rash, back pain, dysuria, hematemesis, bloody stool, near syncope, or syncope. Past Medical History:  Diagnosis Date  . Diabetes mellitus without complication (HCC)   . Hypertension     There are no active problems to display for this patient.   Past Surgical History:  Procedure Laterality Date  . HERNIA REPAIR    . TUBAL LIGATION       OB History   No obstetric history on file.      Home Medications    Prior to Admission medications   Medication Sig Start Date End Date Taking? Authorizing Provider  benazepril (LOTENSIN) 10 MG tablet Take 10 mg by mouth daily.    [provider]  hydrochlorothiazide (HYDRODIURIL) 25 MG tablet Take 25 mg by mouth daily.    [provider]    Family History No family history on file.  Social History Social History   Tobacco Use  . Smoking status: Never Smoker  . Smokeless tobacco: Never Used  Substance Use Topics  . Alcohol use: Yes    Comment: occ  . Drug use: No     Allergies   Naproxen   Review of Systems Review of Systems   Physical Exam Updated Vital Signs BP (!) 140/92 (BP Location: Left Arm) Comment: noncompliant  Pulse 95   Temp 100.1 F (37.8 C) (Oral)   Resp 20   Ht 5\' 7"   (1.702 m)   Wt 75.8 kg   LMP 01/23/2018   SpO2 100%   BMI 26.16 kg/m   Physical Exam Vitals signs and nursing note reviewed.  Constitutional:      General: She is not in acute distress.    Appearance: She is well-developed.  HENT:     Head: Normocephalic and atraumatic.  Eyes:     Pupils: Pupils are equal, round, and reactive to light.  Neck:     Musculoskeletal: Normal range of motion and neck supple.  Cardiovascular:     Rate and Rhythm: Normal rate and regular rhythm.     Heart sounds: Normal heart sounds. No murmur. No friction rub. No gallop.   Pulmonary:     Effort: Pulmonary effort is normal. No respiratory distress.     Breath sounds: Normal breath sounds. No wheezing.  Abdominal:     General: Bowel sounds are normal. There is no distension.     Palpations: Abdomen is soft.     Tenderness: There is no abdominal tenderness.  Musculoskeletal:        General: No tenderness.  Skin:    General: Skin is warm and dry.     Capillary Refill: Capillary refill takes less than 2 seconds.     Findings: No erythema or rash.  Neurological:     Mental Status: She is alert and oriented to person, place,  and time.     Motor: No abnormal muscle tone.     Coordination: Coordination normal.  Psychiatric:        Behavior: Behavior normal.      ED Treatments / Results  Labs (all labs ordered are listed, but only abnormal results are displayed) Labs Reviewed - No data to display  EKG None  Radiology Dg Chest 2 View  Result Date: 01/28/2018 CLINICAL DATA:  Cough for 2 days EXAM: CHEST - 2 VIEW COMPARISON:  05/25/2017 FINDINGS: The heart size and mediastinal contours are within normal limits. Both lungs are clear. The visualized skeletal structures are unremarkable. IMPRESSION: No active cardiopulmonary disease. Electronically Signed   By: Alcide CleverMark  Lukens M.D.   On: 01/28/2018 16:15    Procedures Procedures (including critical care time)  Medications Ordered in  ED Medications - No data to display   Initial Impression / Assessment and Plan / ED Course  I have reviewed the triage vital signs and the nursing notes.  Pertinent labs & imaging results that were available during my care of the patient were reviewed by me and considered in my medical decision making (see chart for details).     Patient will be treated for an influenza-like illness.  The chest x-ray did not show any signs of pneumonia at this time.  Final Clinical Impressions(s) / ED Diagnoses   Final diagnoses:  None    ED Discharge Orders    None       Charlestine NightLawyer, Neko Mcgeehan, PA-C 01/28/18 1651    Rolan BuccoBelfi, Melanie, MD 01/28/18 1757

## 2018-01-28 NOTE — Discharge Instructions (Addendum)
Return here as needed.  Increase your fluid intake.  Rest as much as possible. °

## 2018-01-28 NOTE — ED Triage Notes (Signed)
C/o flu like sx x 2 days-NAD-steady gait 

## 2018-02-07 ENCOUNTER — Other Ambulatory Visit: Payer: Self-pay

## 2018-02-07 ENCOUNTER — Encounter (HOSPITAL_BASED_OUTPATIENT_CLINIC_OR_DEPARTMENT_OTHER): Payer: Self-pay | Admitting: Emergency Medicine

## 2018-02-07 DIAGNOSIS — E119 Type 2 diabetes mellitus without complications: Secondary | ICD-10-CM | POA: Insufficient documentation

## 2018-02-07 DIAGNOSIS — I10 Essential (primary) hypertension: Secondary | ICD-10-CM | POA: Insufficient documentation

## 2018-02-07 DIAGNOSIS — H109 Unspecified conjunctivitis: Secondary | ICD-10-CM | POA: Insufficient documentation

## 2018-02-07 NOTE — ED Triage Notes (Signed)
Pt c/o 4/10 left eye pain and redness since yesterday that she will like it to be check.

## 2018-02-08 ENCOUNTER — Emergency Department (HOSPITAL_BASED_OUTPATIENT_CLINIC_OR_DEPARTMENT_OTHER)
Admission: EM | Admit: 2018-02-08 | Discharge: 2018-02-08 | Disposition: A | Discharge status: Home / Self Care | Payer: Medicaid Other | Attending: Emergency Medicine | Admitting: Emergency Medicine

## 2018-02-08 DIAGNOSIS — H109 Unspecified conjunctivitis: Secondary | ICD-10-CM

## 2018-02-08 MED ORDER — GENTAMICIN SULFATE 0.3 % OP SOLN: 2.0000 [drp] | Freq: Four times a day (QID) | OPHTHALMIC | 0 refills | Status: DC

## 2018-02-08 NOTE — ED Provider Notes (Signed)
[facility] EMERGENCY DEPARTMENT Provider Note   CSN: [id number] Arrival date & time: 02/07/18  2053     History   Chief Complaint Chief Complaint  Patient presents with  . Eye Pain    HPI Paula Robertson is a 37 y.o. female.  Patient is a 37 year old female with history of diabetes and hypertension.  She presents with left eye redness and irritation.  She woke up this morning from sleep with her eyelashes matted.  Her daughter was recently diagnosed with pinkeye.  Patient does wear contact lenses.  The history is provided by the patient.  Eye Pain  This is a new problem. Episode onset: This morning. The problem occurs constantly. The problem has been gradually worsening. Nothing aggravates the symptoms. Nothing relieves the symptoms.    Past Medical History:  Diagnosis Date  . Diabetes mellitus without complication (HCC)   . Hypertension     There are no active problems to display for this patient.   Past Surgical History:  Procedure Laterality Date  . HERNIA REPAIR    . TUBAL LIGATION       OB History   No obstetric history on file.      Home Medications    Prior to Admission medications   Medication Sig Start Date End Date Taking? Authorizing Provider  acetaminophen-codeine 120-12 MG/5ML solution Take 10 mLs by mouth every 4 (four) hours as needed for moderate pain. 01/28/18   Lawyer, Cristal Deerhristopher, PA-C  benazepril (LOTENSIN) 10 MG tablet Take 10 mg by mouth daily.    [provider]  Guaifenesin 1200 MG TB12 Take 1 tablet (1,200 mg total) by mouth 2 (two) times daily. 01/28/18   Lawyer, Cristal Deerhristopher, PA-C  hydrochlorothiazide (HYDRODIURIL) 25 MG tablet Take 25 mg by mouth daily.    [provider]  ibuprofen (ADVIL,MOTRIN) 800 MG tablet Take 1 tablet (800 mg total) by mouth every 8 (eight) hours as needed. 01/28/18   Charlestine NightLawyer, Christopher, PA-C    Family History No family history on file.  Social History Social History    Tobacco Use  . Smoking status: Never Smoker  . Smokeless tobacco: Never Used  Substance Use Topics  . Alcohol use: Yes    Comment: occ  . Drug use: No     Allergies   Naproxen   Review of Systems Review of Systems  Eyes: Positive for pain.  All other systems reviewed and are negative.    Physical Exam Updated Vital Signs BP 133/88 (BP Location: Right Arm)   Pulse 62   Temp 98.7 F (37.1 C) (Oral)   Resp 16   Ht 5\' 7"  (1.702 m)   Wt 75.8 kg   LMP 01/23/2018   SpO2 100%   BMI 26.16 kg/m   Physical Exam Vitals signs and nursing note reviewed.  Constitutional:      Appearance: Normal appearance.  HENT:     Head: Normocephalic.  Eyes:     Comments: The left conjunctiva is injected.  There is a colored contact lens in place which the patient does not want to remove.  Pulmonary:     Effort: Pulmonary effort is normal.  Skin:    General: Skin is warm and dry.  Neurological:     General: No focal deficit present.     Mental Status: She is alert.      ED Treatments / Results  Labs (all labs ordered are listed, but only abnormal results are displayed) Labs Reviewed - No data to display  EKG None  Radiology No results found.  Procedures Procedures (including critical care time)  Medications Ordered in ED Medications - No data to display   Initial Impression / Assessment and Plan / ED Course  I have reviewed the triage vital signs and the nursing notes.  Pertinent labs & imaging results that were available during my care of the patient were reviewed by me and considered in my medical decision making (see chart for details).  Patient not wanting to remove her contact lens and understands that I cannot evaluate the cornea.  She will be treated with gentamicin drops for what appears to be conjunctivitis.  Final Clinical Impressions(s) / ED Diagnoses   Final diagnoses:  None    ED Discharge Orders    None       Geoffery Lyonselo, Chandel Zaun, MD 02/08/18  [phone]

## 2018-02-08 NOTE — Discharge Instructions (Addendum)
Gentamicin drops as prescribed.  No contact lens use for the next 2 weeks.  Return to the ER if symptoms worsen or change.

## 2018-12-14 ENCOUNTER — Emergency Department (HOSPITAL_COMMUNITY)
Admission: EM | Admit: 2018-12-14 | Discharge: 2018-12-14 | Disposition: A | Discharge status: Home / Self Care | Payer: BC Managed Care – PPO | Attending: Emergency Medicine | Admitting: Emergency Medicine

## 2018-12-14 ENCOUNTER — Emergency Department (HOSPITAL_COMMUNITY): Payer: BC Managed Care – PPO

## 2018-12-14 ENCOUNTER — Encounter (HOSPITAL_COMMUNITY): Payer: Self-pay | Admitting: Obstetrics and Gynecology

## 2018-12-14 ENCOUNTER — Other Ambulatory Visit: Payer: Self-pay

## 2018-12-14 DIAGNOSIS — I10 Essential (primary) hypertension: Secondary | ICD-10-CM | POA: Insufficient documentation

## 2018-12-14 DIAGNOSIS — E119 Type 2 diabetes mellitus without complications: Secondary | ICD-10-CM | POA: Insufficient documentation

## 2018-12-14 DIAGNOSIS — R079 Chest pain, unspecified: Secondary | ICD-10-CM

## 2018-12-14 DIAGNOSIS — M25512 Pain in left shoulder: Secondary | ICD-10-CM | POA: Diagnosis not present

## 2018-12-14 DIAGNOSIS — R110 Nausea: Secondary | ICD-10-CM | POA: Insufficient documentation

## 2018-12-14 LAB — CBC
HCT: 37.4 % (ref 36.0–46.0)
Hemoglobin: 11.9 g/dL — ABNORMAL LOW (ref 12.0–15.0)
MCH: 28.4 pg (ref 26.0–34.0)
MCHC: 31.8 g/dL (ref 30.0–36.0)
MCV: 89.3 fL (ref 80.0–100.0)
Platelets: 287 10*3/uL (ref 150–400)
RBC: 4.19 MIL/uL (ref 3.87–5.11)
RDW: 12.3 % (ref 11.5–15.5)
WBC: 6.9 10*3/uL (ref 4.0–10.5)
nRBC: 0 % (ref 0.0–0.2)

## 2018-12-14 LAB — I-STAT BETA HCG BLOOD, ED (MC, WL, AP ONLY): I-stat hCG, quantitative: <5 m[IU]/mL (ref <5)

## 2018-12-14 LAB — BASIC METABOLIC PANEL
Anion gap: 8 (ref 5–15)
BUN: 15 mg/dL (ref 6–20)
CO2: 27 mmol/L (ref 22–32)
Calcium: 9 mg/dL (ref 8.9–10.3)
Chloride: 102 mmol/L (ref 98–111)
Creatinine, Ser: 0.79 mg/dL (ref 0.44–1.00)
GFR calc Af Amer: >60 mL/min (ref >60)
GFR calc non Af Amer: >60 mL/min (ref >60)
Glucose, Bld: 131 mg/dL — ABNORMAL HIGH (ref 70–99)
Potassium: 3.4 mmol/L — ABNORMAL LOW (ref 3.5–5.1)
Sodium: 137 mmol/L (ref 135–145)

## 2018-12-14 LAB — TROPONIN I (HIGH SENSITIVITY): Troponin I (High Sensitivity): 3 ng/L (ref <18)

## 2018-12-14 MED ORDER — BENAZEPRIL HCL 10 MG PO TABS
10.0000 mg | ORAL_TABLET | Freq: Every day | ORAL | Status: DC
  Administered 2018-12-14: 10 mg via ORAL
  Filled 2018-12-14: qty 1

## 2018-12-14 MED ORDER — SODIUM CHLORIDE 0.9% FLUSH
3.0000 mL | Freq: Once | INTRAVENOUS | Status: AC
  Administered 2018-12-14: 3 mL via INTRAVENOUS

## 2018-12-14 MED ORDER — BENAZEPRIL HCL 10 MG PO TABS: 10.0000 mg | ORAL_TABLET | Freq: Every day | ORAL | 1 refills | Status: DC

## 2018-12-14 MED ORDER — ALUM & MAG HYDROXIDE-SIMETH 200-200-20 MG/5ML PO SUSP
30.0000 mL | Freq: Once | ORAL | Status: AC
  Administered 2018-12-14: 30 mL via ORAL
  Filled 2018-12-14: qty 30

## 2018-12-14 MED ORDER — KETOROLAC TROMETHAMINE 15 MG/ML IJ SOLN
15.0000 mg | Freq: Once | INTRAMUSCULAR | Status: AC
  Administered 2018-12-14: 15 mg via INTRAVENOUS
  Filled 2018-12-14: qty 1

## 2018-12-14 MED ORDER — LIDOCAINE VISCOUS HCL 2 % MT SOLN
15.0000 mL | Freq: Once | OROMUCOSAL | Status: AC
  Administered 2018-12-14: 15 mL via ORAL
  Filled 2018-12-14: qty 15

## 2018-12-14 NOTE — ED Notes (Signed)
Waiting for Lotensin to be sent down by pharmacy for administration.

## 2018-12-14 NOTE — ED Triage Notes (Signed)
Patient presents to the ED with c/o chest pain that radiates to left arm. Patient reports hx of type 2 DM and HTN. Patient reports last period around 11th of last month. Patient states she has no prior hx of cardiac events. Patient denies emesis or diarrhea at this time.

## 2018-12-14 NOTE — Discharge Instructions (Addendum)
You can take Motrin or ibuprofen 600 mg every 6 hours as needed for pain at home.  Please do not take this medication more than 7 days.  Prolonged use of this medicine can cause kidney problems and ulcers.  We will also prescribe you your blood pressure medications which should begin taking again.  Please call the cardiology clinic at the number listed above to schedule follow-up for this week.  You have cardiac risk factors and should be seen by a heart doctor within the next month if possible.

## 2018-12-14 NOTE — ED Provider Notes (Signed)
[name] DEPT Provider Note   CSN: [id number] Arrival date & time: 12/14/18  0831     History   Chief Complaint Chief Complaint  Patient presents with   Chest Pain    HPI Paula Robertson is a 38 y.o. female history of hypertension, borderline diabetes, presented to emergency department complaint of chest pain.  Patient reports that she began having upper left-sided chest and left shoulder pain yesterday evening while at rest.  She describes it like a pressure sensation in her shoulder.  She states it has been constant overnight and this morning when she woke up she felt the pressure had worsened in her midline and epigastrium.  She describes some nausea with this but no vomiting.  She denies ever having this kind of pain before.  She denies that it radiates anywhere else.  She denies any diaphoresis or syncope.  She denies any history of angina.  She reports a family history of MI in her mother under age of 58, as well as one of her uncles, who she states died young from a massive heart attack.  No hemoptysis or asymmetric LE edema. Patient denies personal history of DVT or PE, but reports hx of DVT in her father. No recent hormone use (including OCP); travel for >6 hours; prolonged immobilization for greater than 3 days; surgeries or trauma in the last 4 weeks; or malignancy with treatment within 6 months.  HPI: A 38 year old patient with a history of treated diabetes and hypertension presents for evaluation of chest pain. Initial onset of pain was more than 6 hours ago. The patient's chest pain is described as heaviness/pressure/tightness and is not worse with exertion. The patient complains of nausea. The patient's chest pain is middle- or left-sided, is not well-localized, is not sharp and does not radiate to the arms/jaw/neck. The patient denies diaphoresis. The patient has a family history of coronary artery disease in a first-degree relative with onset  less than age 79. The patient has no history of stroke, has no history of peripheral artery disease, has not smoked in the past 90 days, has no history of hypercholesterolemia and does not have an elevated BMI (>=30).  HPI  Past Medical History:  Diagnosis Date   Diabetes mellitus without complication ([place])    Hypertension     There are no active problems to display for this patient.   Past Surgical History:  Procedure Laterality Date   HERNIA REPAIR     TUBAL LIGATION       OB History    Gravida      Para      Term      Preterm      AB      Living  4     SAB      TAB      Ectopic      Multiple      Live Births               Home Medications    Prior to Admission medications   Medication Sig Start Date End Date Taking? Authorizing Provider  acetaminophen (TYLENOL) 325 MG tablet Take 650 mg by mouth every 6 (six) hours as needed for headache (migraine).   Yes [provider]  ibuprofen (ADVIL,MOTRIN) 800 MG tablet Take 1 tablet (800 mg total) by mouth every 8 (eight) hours as needed. 01/28/18  Yes Lawyer, Harrell Gave, PA-C  acetaminophen-codeine 120-12 MG/5ML solution Take 10 mLs by mouth every 4 (  four) hours as needed for moderate pain. Patient not taking: Reported on 12/14/2018 01/28/18   Charlestine NightLawyer, Christopher, PA-C  benazepril (LOTENSIN) 10 MG tablet Take 1 tablet (10 mg total) by mouth daily. 12/14/18   Terald Sleeperrifan, Jearlean Demauro J, MD  gentamicin (GARAMYCIN) 0.3 % ophthalmic solution Place 2 drops into both eyes 4 (four) times daily. Patient not taking: Reported on 12/14/2018 02/08/18   Geoffery Lyonselo, Douglas, MD  Guaifenesin 1200 MG TB12 Take 1 tablet (1,200 mg total) by mouth 2 (two) times daily. Patient not taking: Reported on 12/14/2018 01/28/18   Charlestine NightLawyer, Christopher, PA-C    Family History Family History  Problem Relation Age of Onset   Stroke Mother    Heart failure Father    Heart attack Father     Social History Social History   Tobacco Use    Smoking status: Never Smoker   Smokeless tobacco: Never Used  Substance Use Topics   Alcohol use: Yes    Comment: occ   Drug use: No     Allergies   Naproxen   Review of Systems Review of Systems  Constitutional: Negative for chills and fever.  HENT: Negative for ear pain and sore throat.   Eyes: Negative for photophobia and visual disturbance.  Respiratory: Positive for chest tightness.   Cardiovascular: Positive for chest pain. Negative for palpitations and leg swelling.  Gastrointestinal: Positive for nausea. Negative for abdominal pain, constipation, diarrhea and vomiting.  Genitourinary: Negative for difficulty urinating and hematuria.  Musculoskeletal: Negative for arthralgias and back pain.  Skin: Negative for color change and rash.  Neurological: Negative for syncope and light-headedness.  All other systems reviewed and are negative.    Physical Exam Updated Vital Signs BP (!) 155/100    Pulse 80    Temp 97.9 F (36.6 C) (Oral)    Resp (!) 24    LMP 11/20/2018 Comment: Tubal ligation   SpO2 100%   Physical Exam Vitals signs and nursing note reviewed.  Constitutional:      General: She is not in acute distress.    Appearance: She is well-developed.  HENT:     Head: Normocephalic and atraumatic.  Eyes:     Conjunctiva/sclera: Conjunctivae normal.  Neck:     Musculoskeletal: Neck supple.  Cardiovascular:     Rate and Rhythm: Normal rate and regular rhythm.     Heart sounds: No murmur.  Pulmonary:     Effort: Pulmonary effort is normal. No respiratory distress.     Breath sounds: Normal breath sounds.  Abdominal:     Palpations: Abdomen is soft.     Tenderness: There is no abdominal tenderness.  Musculoskeletal:     Right lower leg: She exhibits no tenderness. No edema.     Left lower leg: She exhibits no tenderness. No edema.  Skin:    General: Skin is warm and dry.  Neurological:     General: No focal deficit present.     Mental Status: She is  alert and oriented to person, place, and time.    ED Treatments / Results  Labs (all labs ordered are listed, but only abnormal results are displayed) Labs Reviewed  BASIC METABOLIC PANEL - Abnormal; Notable for the following components:      Result Value   Potassium 3.4 (*)    Glucose, Bld 131 (*)    All other components within normal limits  CBC - Abnormal; Notable for the following components:   Hemoglobin 11.9 (*)    All other components within  normal limits  I-STAT BETA HCG BLOOD, ED ([facility], [facility], [facility] ONLY)  TROPONIN I (HIGH SENSITIVITY)    EKG EKG Interpretation  Date/Time:  Wednesday December 14 2018 08:54:51 EST Ventricular Rate:  65 PR Interval:    QRS Duration: 83 QT Interval:  434 QTC Calculation: 452 R Axis:   31 Text Interpretation: Sinus rhythm Baseline wander in lead(s) II III aVF No STEMI Confirmed by Alvester Chou [id number]) on 12/14/2018 9:19:58 AM   Radiology Dg Chest 2 View  Result Date: 12/14/2018 CLINICAL DATA:  Chest pain EXAM: CHEST - 2 VIEW COMPARISON:  01/28/2018 FINDINGS: The heart size and mediastinal contours are within normal limits. Both lungs are clear. The visualized skeletal structures are unremarkable. IMPRESSION: No acute abnormality of the lungs. Electronically Signed   By: Lauralyn Primes M.D.   On: 12/14/2018 09:24    Procedures Procedures (including critical care time)  Medications Ordered in ED Medications  sodium chloride flush (NS) 0.9 % injection 3 mL (3 mLs Intravenous Given 12/14/18 0927)  alum & mag hydroxide-simeth (MAALOX/MYLANTA) 200-200-20 MG/5ML suspension 30 mL (30 mLs Oral Given 12/14/18 0954)    And  lidocaine (XYLOCAINE) 2 % viscous mouth solution 15 mL (15 mLs Oral Given 12/14/18 0954)  ketorolac (TORADOL) 15 MG/ML injection 15 mg (15 mg Intravenous Given 12/14/18 1112)     Initial Impression / Assessment and Plan / ED Course  I have reviewed the triage vital signs and the nursing notes.  Pertinent labs & imaging results  that were available during my care of the patient were reviewed by me and considered in my medical decision making (see chart for details).  38 yo female presenting with chest pain and shoulder pain that began at rest last night, persistent into this morning.  No hx of angina.  Does have family hx of MI in mother and uncle at younger age.    DDx includes ACS vs. Reflux vs. Muscular pain vs. Neuropathy   PNA less likely with clear chest xray, no infectious symptoms No PTX on xray  Patient is PERC negative, I have a low suspicion for PE at this time.  Plan to check troponins, trial GI cocktail BMP and CBC pending Pregnancy negative  Clinical Course as of Dec 15 747  Wed Dec 14, 2018  1017 Trop 3 after > 6 hours of symptoms, with active symptoms now, makes ischemic cardiac disease a less likely cause of her pain. Her  HEART score of 3.  Will treat the patient symptomatic here and have her follow up with cardiology as an outpatient   [MT]  1217 Patient reports significant provement of her symptoms after Toradol.  We will discharge her with advised to take Motrin as needed for the next couple days.  Also have her see cardiology given her heart score of 3 and need for follow-up.   [MT]    Clinical Course User Index [MT] Zalen Sequeira, Kermit Balo, MD   This note was dictated using dragon dictation software.  Please be aware that there may be minor translation errors as a result of this oral dictation    Final Clinical Impressions(s) / ED Diagnoses   Final diagnoses:  Chest pain, unspecified type    ED Discharge Orders         Ordered    benazepril (LOTENSIN) 10 MG tablet  Daily     12/14/18 1216           Terald Sleeper, MD 12/15/18 [phone]

## 2018-12-29 ENCOUNTER — Ambulatory Visit: Payer: BC Managed Care – PPO | Admitting: Cardiology

## 2018-12-29 NOTE — Progress Notes (Deleted)
Cardiology Office Note:    Date:  12/29/2018   ID:  Dalene Seltzer, DOB 08-21-1980, MRN [id number]  PCP:  Renaye Rakers, MD  Cardiologist:  No primary care provider on file.  Electrophysiologist:  None   Referring MD: Renaye Rakers, MD   No chief complaint on file. ***  History of Present Illness:    Paula Robertson is a 38 y.o. female with a hx of diabetes, hypertension who presents for ED follow-up.  She was seen in the ED on 11//20 for chest pain.  Reported upper left-sided chest pain that has begun the evening prior to presentation.  She described constant pain.  Negative troponin despite continuous chest pain.  Past Medical History:  Diagnosis Date  . Diabetes mellitus without complication (HCC)   . Hypertension     Past Surgical History:  Procedure Laterality Date  . HERNIA REPAIR    . TUBAL LIGATION      Current Medications: No outpatient medications have been marked as taking for the 12/29/18 encounter (Appointment) with Little Ishikawa, MD.     Allergies:   Naproxen   Social History   Socioeconomic History  . Marital status: Single    Spouse name: Not on file  . Number of children: Not on file  . Years of education: Not on file  . Highest education level: Not on file  Occupational History  . Not on file  Social Needs  . Financial resource strain: Not on file  . Food insecurity    Worry: Not on file    Inability: Not on file  . Transportation needs    Medical: Not on file    Non-medical: Not on file  Tobacco Use  . Smoking status: Never Smoker  . Smokeless tobacco: Never Used  Substance and Sexual Activity  . Alcohol use: Yes    Comment: occ  . Drug use: No  . Sexual activity: Not on file  Lifestyle  . Physical activity    Days per week: Not on file    Minutes per session: Not on file  . Stress: Not on file  Relationships  . Social Musician on phone: Not on file    Gets together: Not on file    Attends religious  service: Not on file    Active member of club or organization: Not on file    Attends meetings of clubs or organizations: Not on file    Relationship status: Not on file  Other Topics Concern  . Not on file  Social History Narrative  . Not on file     Family History: The patient's ***family history includes Heart attack in her father; Heart failure in her father; Stroke in her mother.  ROS:   Please see the history of present illness.    *** All other systems reviewed and are negative.  EKGs/Labs/Other Studies Reviewed:    The following studies were reviewed today: ***  EKG:  EKG is *** ordered today.  The ekg ordered today demonstrates ***  Recent Labs: 12/14/2018: BUN 15; Creatinine, Ser 0.79; Hemoglobin 11.9; Platelets 287; Potassium 3.4; Sodium 137  Recent Lipid Panel No results found for: CHOL, TRIG, HDL, CHOLHDL, VLDL, LDLCALC, LDLDIRECT  Physical Exam:    VS:  There were no vitals taken for this visit.    Wt Readings from Last 3 Encounters:  02/07/18 167 lb (75.8 kg)  01/28/18 167 lb (75.8 kg)  12/30/17 165 lb (74.8 kg)     GEN: ***  Well nourished, well developed in no acute distress HEENT: Normal NECK: No JVD; No carotid bruits LYMPHATICS: No lymphadenopathy CARDIAC: ***RRR, no murmurs, rubs, gallops RESPIRATORY:  Clear to auscultation without rales, wheezing or rhonchi  ABDOMEN: Soft, non-tender, non-distended MUSCULOSKELETAL:  No edema; No deformity  SKIN: Warm and dry NEUROLOGIC:  Alert and oriented x 3 PSYCHIATRIC:  Normal affect   ASSESSMENT:    No diagnosis found. PLAN:    In order of problems listed above:  Chest pain:  Hypertension: On benazepril 10 mg daily   Medication Adjustments/Labs and Tests Ordered: Current medicines are reviewed at length with the patient today.  Concerns regarding medicines are outlined above.  No orders of the defined types were placed in this encounter.  No orders of the defined types were placed in this  encounter.   There are no Patient Instructions on file for this visit.   Signed, Donato Heinz, MD  12/29/2018 12:55 PM    [place]

## 2019-02-20 ENCOUNTER — Ambulatory Visit: Payer: BC Managed Care – PPO | Admitting: Cardiology

## 2019-10-17 ENCOUNTER — Other Ambulatory Visit: Payer: Self-pay

## 2019-10-17 ENCOUNTER — Emergency Department (HOSPITAL_BASED_OUTPATIENT_CLINIC_OR_DEPARTMENT_OTHER)
Admission: EM | Admit: 2019-10-17 | Discharge: 2019-10-17 | Disposition: A | Discharge status: Home / Self Care | Payer: BC Managed Care – PPO | Attending: Emergency Medicine | Admitting: Emergency Medicine

## 2019-10-17 ENCOUNTER — Encounter (HOSPITAL_BASED_OUTPATIENT_CLINIC_OR_DEPARTMENT_OTHER): Payer: Self-pay | Admitting: Emergency Medicine

## 2019-10-17 DIAGNOSIS — I10 Essential (primary) hypertension: Secondary | ICD-10-CM | POA: Insufficient documentation

## 2019-10-17 DIAGNOSIS — R112 Nausea with vomiting, unspecified: Secondary | ICD-10-CM | POA: Insufficient documentation

## 2019-10-17 DIAGNOSIS — M542 Cervicalgia: Secondary | ICD-10-CM | POA: Insufficient documentation

## 2019-10-17 DIAGNOSIS — G43909 Migraine, unspecified, not intractable, without status migrainosus: Secondary | ICD-10-CM | POA: Insufficient documentation

## 2019-10-17 DIAGNOSIS — Z20822 Contact with and (suspected) exposure to covid-19: Secondary | ICD-10-CM | POA: Insufficient documentation

## 2019-10-17 DIAGNOSIS — E119 Type 2 diabetes mellitus without complications: Secondary | ICD-10-CM | POA: Insufficient documentation

## 2019-10-17 HISTORY — DX: Migraine, unspecified, not intractable, without status migrainosus: G43.909

## 2019-10-17 LAB — SARS CORONAVIRUS 2 BY RT PCR (HOSPITAL ORDER, PERFORMED IN ~~LOC~~ HOSPITAL LAB): SARS Coronavirus 2: NEGATIVE

## 2019-10-17 MED ORDER — ONDANSETRON 4 MG PO TBDP: 4.0000 mg | ORAL_TABLET | Freq: Three times a day (TID) | ORAL | 0 refills | Status: DC | PRN

## 2019-10-17 MED ORDER — ONDANSETRON 4 MG PO TBDP
4.0000 mg | ORAL_TABLET | Freq: Once | ORAL | Status: AC
  Administered 2019-10-17: 4 mg via ORAL
  Filled 2019-10-17: qty 1

## 2019-10-17 MED ORDER — IBUPROFEN 800 MG PO TABS
800.0000 mg | ORAL_TABLET | Freq: Once | ORAL | Status: AC
  Administered 2019-10-17: 800 mg via ORAL
  Filled 2019-10-17: qty 1

## 2019-10-17 NOTE — Discharge Instructions (Signed)

## 2019-10-17 NOTE — ED Triage Notes (Signed)
Nausea last night, woke up this am with n/v and h/a.

## 2019-10-17 NOTE — ED Provider Notes (Signed)
Emergency Department Provider Note   I have reviewed the triage vital signs and the nursing notes.   HISTORY  Chief Complaint Migraine   HPI Paula Robertson is a 39 y.o. female with PMH of migraine HA, DM, and HTN presents to the ED with migraine HA and vomiting. Symptoms began yesterday and have worsened through the night.  Patient has a throbbing headache with neck tightness which is typical of her migraine headaches.  She had some associated nausea when she arrived to work this morning had an episode of vomiting.  After the vomiting she was sent to the emergency department by her job for evaluation and Covid testing.  Patient had Covid last year and has been fully vaccinated since.  She denies any chest pain or shortness of breath.  No abdominal or back pain.  No associated diarrhea. No vision change.    Past Medical History:  Diagnosis Date  . Diabetes mellitus without complication (HCC)   . Hypertension   . Migraines     There are no problems to display for this patient.   Past Surgical History:  Procedure Laterality Date  . HERNIA REPAIR    . TUBAL LIGATION      Allergies Toradol [ketorolac tromethamine] and Naproxen  Family History  Problem Relation Age of Onset  . Stroke Mother   . Heart failure Father   . Heart attack Father     Social History Social History   Tobacco Use  . Smoking status: Never Smoker  . Smokeless tobacco: Never Used  Vaping Use  . Vaping Use: Never used  Substance Use Topics  . Alcohol use: Yes    Comment: occ  . Drug use: No    Review of Systems  Constitutional: No fever/chills Eyes: No visual changes. ENT: No sore throat. Cardiovascular: Denies chest pain. Respiratory: Denies shortness of breath. Gastrointestinal: No abdominal pain. Positive nausea and vomiting.  No diarrhea.  No constipation. Genitourinary: Negative for dysuria. Musculoskeletal: Negative for back pain. Skin: Negative for rash. Neurological: Negative  for focal weakness or numbness. Positive migraine.   10-point ROS otherwise negative.  ____________________________________________   PHYSICAL EXAM:  VITAL SIGNS: ED Triage Vitals  Enc Vitals Group     BP 10/17/19 0558 140/77     Pulse Rate 10/17/19 0558 75     Resp 10/17/19 0558 16     Temp 10/17/19 0558 98.7 F (37.1 C)     Temp Source 10/17/19 0558 Oral     SpO2 10/17/19 0558 99 %     Weight 10/17/19 0558 199 lb 14.4 oz (90.7 kg)     Height 10/17/19 0558 5\' 7"  (1.702 m)   Constitutional: Alert and oriented. Well appearing and in no acute distress. Eyes: Conjunctivae are normal. Mild photophobia.  Head: Atraumatic.  Nose: No congestion/rhinnorhea. Mouth/Throat: Mucous membranes are moist.   Neck: No stridor.  No meningeal signs.   Cardiovascular: Normal rate, regular rhythm. Good peripheral circulation. Grossly normal heart sounds.   Respiratory: Normal respiratory effort.  No retractions. Lungs CTAB. Gastrointestinal: Soft and nontender. No distention.  Musculoskeletal: No gross deformities of extremities. Neurologic:  Normal speech and language. No gross focal neurologic deficits are appreciated.  Skin:  Skin is warm, dry and intact. No rash noted.  ____________________________________________   LABS (all labs ordered are listed, but only abnormal results are displayed)  Labs Reviewed  SARS CORONAVIRUS 2 BY RT PCR (HOSPITAL ORDER, PERFORMED IN  HOSPITAL LAB)   ____________________________________________   PROCEDURES  Procedure(s) performed:   Procedures  None  ____________________________________________   INITIAL IMPRESSION / ASSESSMENT AND PLAN / ED COURSE  Pertinent labs & imaging results that were available during my care of the patient were reviewed by me and considered in my medical decision making (see chart for details).   Patient presents to the emergency department for evaluation of nausea and vomiting this morning in the setting  of migraine headache.  Headache is typical of her migraines.  Vital signs are unremarkable.  Lower suspicion for Covid infection overall but patient was sent in by her employer for evaluation and testing.  I have sent a Covid PCR test the patient will follow the test results in the MyChart app.  Abdomen is diffusely soft and nontender.  Do not see indication for emergent lab work or imaging at this time.  Plan for Zofran, Motrin, Covid testing, and likely discharge.   COVID swab sent and result pending. Patient to follow test results in MyChart app on her phone. Zofran sent to pharmacy. Work note provided. Discussed ED return precautions.  ____________________________________________  FINAL CLINICAL IMPRESSION(S) / ED DIAGNOSES  Final diagnoses:  Migraine without status migrainosus, not intractable, unspecified migraine type  Non-intractable vomiting with nausea, unspecified vomiting type     MEDICATIONS GIVEN DURING THIS VISIT:  Medications  ondansetron (ZOFRAN-ODT) disintegrating tablet 4 mg (4 mg Oral Given 10/17/19 0622)  ibuprofen (ADVIL) tablet 800 mg (800 mg Oral Given 10/17/19 0623)     NEW OUTPATIENT MEDICATIONS STARTED DURING THIS VISIT:  New Prescriptions   ONDANSETRON (ZOFRAN ODT) 4 MG DISINTEGRATING TABLET    Take 1 tablet (4 mg total) by mouth every 8 (eight) hours as needed.    Note:  This document was prepared using Dragon voice recognition software and may include unintentional dictation errors.  Alona Bene, MD, [facility] Emergency Medicine    Rucha Wissinger, Arlyss Repress, MD 10/17/19 [phone]

## 2019-12-12 ENCOUNTER — Encounter (HOSPITAL_BASED_OUTPATIENT_CLINIC_OR_DEPARTMENT_OTHER): Payer: Self-pay

## 2019-12-12 ENCOUNTER — Other Ambulatory Visit: Payer: Self-pay

## 2019-12-12 ENCOUNTER — Emergency Department (HOSPITAL_BASED_OUTPATIENT_CLINIC_OR_DEPARTMENT_OTHER)
Admission: EM | Admit: 2019-12-12 | Discharge: 2019-12-12 | Disposition: A | Discharge status: Home / Self Care | Payer: Managed Care, Other (non HMO) | Attending: Emergency Medicine | Admitting: Emergency Medicine

## 2019-12-12 DIAGNOSIS — G43409 Hemiplegic migraine, not intractable, without status migrainosus: Secondary | ICD-10-CM | POA: Diagnosis not present

## 2019-12-12 DIAGNOSIS — E119 Type 2 diabetes mellitus without complications: Secondary | ICD-10-CM | POA: Diagnosis not present

## 2019-12-12 DIAGNOSIS — Z79899 Other long term (current) drug therapy: Secondary | ICD-10-CM | POA: Insufficient documentation

## 2019-12-12 DIAGNOSIS — G43909 Migraine, unspecified, not intractable, without status migrainosus: Secondary | ICD-10-CM | POA: Diagnosis present

## 2019-12-12 DIAGNOSIS — Z760 Encounter for issue of repeat prescription: Secondary | ICD-10-CM | POA: Insufficient documentation

## 2019-12-12 DIAGNOSIS — I10 Essential (primary) hypertension: Secondary | ICD-10-CM | POA: Diagnosis not present

## 2019-12-12 MED ORDER — DEXAMETHASONE SODIUM PHOSPHATE 10 MG/ML IJ SOLN
10.0000 mg | Freq: Once | INTRAMUSCULAR | Status: AC
  Administered 2019-12-12: 10 mg via INTRAVENOUS
  Filled 2019-12-12: qty 1

## 2019-12-12 MED ORDER — ONDANSETRON HCL 4 MG/2ML IJ SOLN: 4.0000 mg | Freq: Once | INTRAMUSCULAR | Status: DC

## 2019-12-12 MED ORDER — METOCLOPRAMIDE HCL 5 MG/ML IJ SOLN
10.0000 mg | Freq: Once | INTRAMUSCULAR | Status: AC
  Administered 2019-12-12: 10 mg via INTRAVENOUS
  Filled 2019-12-12: qty 2

## 2019-12-12 MED ORDER — ENALAPRILAT 1.25 MG/ML IV SOLN
1.2500 mg | Freq: Once | INTRAVENOUS | Status: AC
  Administered 2019-12-12: 1.25 mg via INTRAVENOUS
  Filled 2019-12-12: qty 2

## 2019-12-12 MED ORDER — DIPHENHYDRAMINE HCL 50 MG/ML IJ SOLN
25.0000 mg | Freq: Once | INTRAMUSCULAR | Status: AC
  Administered 2019-12-12: 25 mg via INTRAVENOUS
  Filled 2019-12-12: qty 1

## 2019-12-12 MED ORDER — SODIUM CHLORIDE 0.9 % IV BOLUS
1000.0000 mL | Freq: Once | INTRAVENOUS | Status: AC
  Administered 2019-12-12: 1000 mL via INTRAVENOUS

## 2019-12-12 MED ORDER — BENAZEPRIL HCL 10 MG PO TABS: 10.0000 mg | ORAL_TABLET | Freq: Every day | ORAL | 0 refills | Status: DC

## 2019-12-12 NOTE — ED Triage Notes (Signed)
Migraine x 2 weeks, woke up today and did not feel right. Checked BP ~30 mins ago 180/101, has HTN, has not been taking meds for ~2 weeks due to no refills/change in insurance. Also c/o 'lump' on left side that has "been there for a while" but it now bothering her.

## 2019-12-12 NOTE — ED Provider Notes (Signed)
[facility] Provider Note: Paula Dell, MD, FACEP  CSN: [id number] MRN: [id number] ARRIVAL: 12/12/19 at 0002 ROOM: [facility]01/[facility]01   CHIEF COMPLAINT  Migraine   HISTORY OF PRESENT ILLNESS  12/12/19 2:52 AM Paula Robertson is a 39 y.o. female with a history of migraines.  She is here with a migraine for 2 weeks.  The pain is located bitemporally and she rates it as a 10 out of 10.  She states the pain has been coming and going for the past 2 weeks but worsened yesterday.  She has been taking ibuprofen without relief.  She has had associated photophobia and nausea.  She also notes her blood pressure has been up which she attributes to being out of her benazepril for 2 weeks.  She also has a lump on the posterior aspect of her right axilla that has been present for over a year.  It has become more noticeable recently.  It is not draining.   Past Medical History:  Diagnosis Date  . Diabetes mellitus without complication (HCC)   . Hypertension   . Migraines     Past Surgical History:  Procedure Laterality Date  . HERNIA REPAIR    . TUBAL LIGATION      Family History  Problem Relation Age of Onset  . Stroke Mother   . Heart failure Father   . Heart attack Father     Social History   Tobacco Use  . Smoking status: Never Smoker  . Smokeless tobacco: Never Used  Vaping Use  . Vaping Use: Never used  Substance Use Topics  . Alcohol use: Yes    Comment: occ  . Drug use: No    Prior to Admission medications   Medication Sig Start Date End Date Taking? Authorizing Provider  benazepril (LOTENSIN) 10 MG tablet Take 1 tablet (10 mg total) by mouth daily. 12/12/19   Irem Stoneham, MD  ondansetron (ZOFRAN ODT) 4 MG disintegrating tablet Take 1 tablet (4 mg total) by mouth every 8 (eight) hours as needed. 10/17/19   Long, Arlyss Repress, MD    Allergies Toradol [ketorolac tromethamine] and Naproxen   REVIEW OF SYSTEMS  Negative except as noted here or in the History of  Present Illness.   PHYSICAL EXAMINATION  Initial Vital Signs Blood pressure (!) 166/93, pulse 82, temperature 98.6 F (37 C), temperature source Oral, resp. rate 16, height 5\' 7"  (1.702 m), weight 87.5 kg, SpO2 100 %.  Examination General: Well-developed, well-nourished female in no acute distress; appearance consistent with age of record HENT: normocephalic; atraumatic Eyes: pupils equal, round and reactive to light; extraocular muscles intact; photophobia Neck: supple Heart: regular rate and rhythm Lungs: clear to auscultation bilaterally Abdomen: soft; nondistended; nontender; bowel sounds present Extremities: No deformity; full range of motion; pulses normal Neurologic: Awake, alert and oriented; motor function intact in all extremities and symmetric; no facial droop Skin: Warm and dry; mildly tender, rubbery mass of right posterior axilla Psychiatric: Normal mood and affect   RESULTS  Summary of this visit's results, reviewed and interpreted by myself:   EKG Interpretation  Date/Time:    Ventricular Rate:    PR Interval:    QRS Duration:   QT Interval:    QTC Calculation:   R Axis:     Text Interpretation:        Laboratory Studies: No results found for this or any previous visit (from the past 24 hour(s)). Imaging Studies: No results found.  ED COURSE and MDM  Nursing notes, initial and subsequent vitals signs, including pulse oximetry, reviewed and interpreted by myself.  Vitals:   12/12/19 0010 12/12/19 0400  BP: (!) 166/93 (!) 151/84  Pulse: 82 82  Resp: 16 17  Temp: 98.6 F (37 C)   TempSrc: Oral   SpO2: 100% 99%  Weight: 87.5 kg   Height: 5\' 7"  (1.702 m)    Medications  sodium chloride 0.9 % bolus 1,000 mL ( Intravenous Stopped 12/12/19 0442)  diphenhydrAMINE (BENADRYL) injection 25 mg (25 mg Intravenous Given 12/12/19 0330)  metoCLOPramide (REGLAN) injection 10 mg (10 mg Intravenous Given 12/12/19 0331)  dexamethasone (DECADRON) injection 10 mg  (10 mg Intravenous Given 12/12/19 0330)  enalaprilat (VASOTEC) injection 1.25 mg (1.25 mg Intravenous Given 12/12/19 0331)   5:06 AM Patient feeling significantly better after IV fluids and medications.  States she is ready to go home.   PROCEDURES  Procedures   ED DIAGNOSES     ICD-10-CM   1. Sporadic migraine  G43.409   2. Hypertension not at goal  I10   3. Medication refill  Z76.0        Rubyann Lingle, 13/[birth year], MD 12/12/19 0510

## 2020-01-03 IMAGING — CR DG CHEST 2V
2 series · 2 of 2 positions shown · non-contrast
Comparison: 01/28/2018

CLINICAL DATA: Chest pain

EXAM:
CHEST - 2 VIEW

[w chest pa]
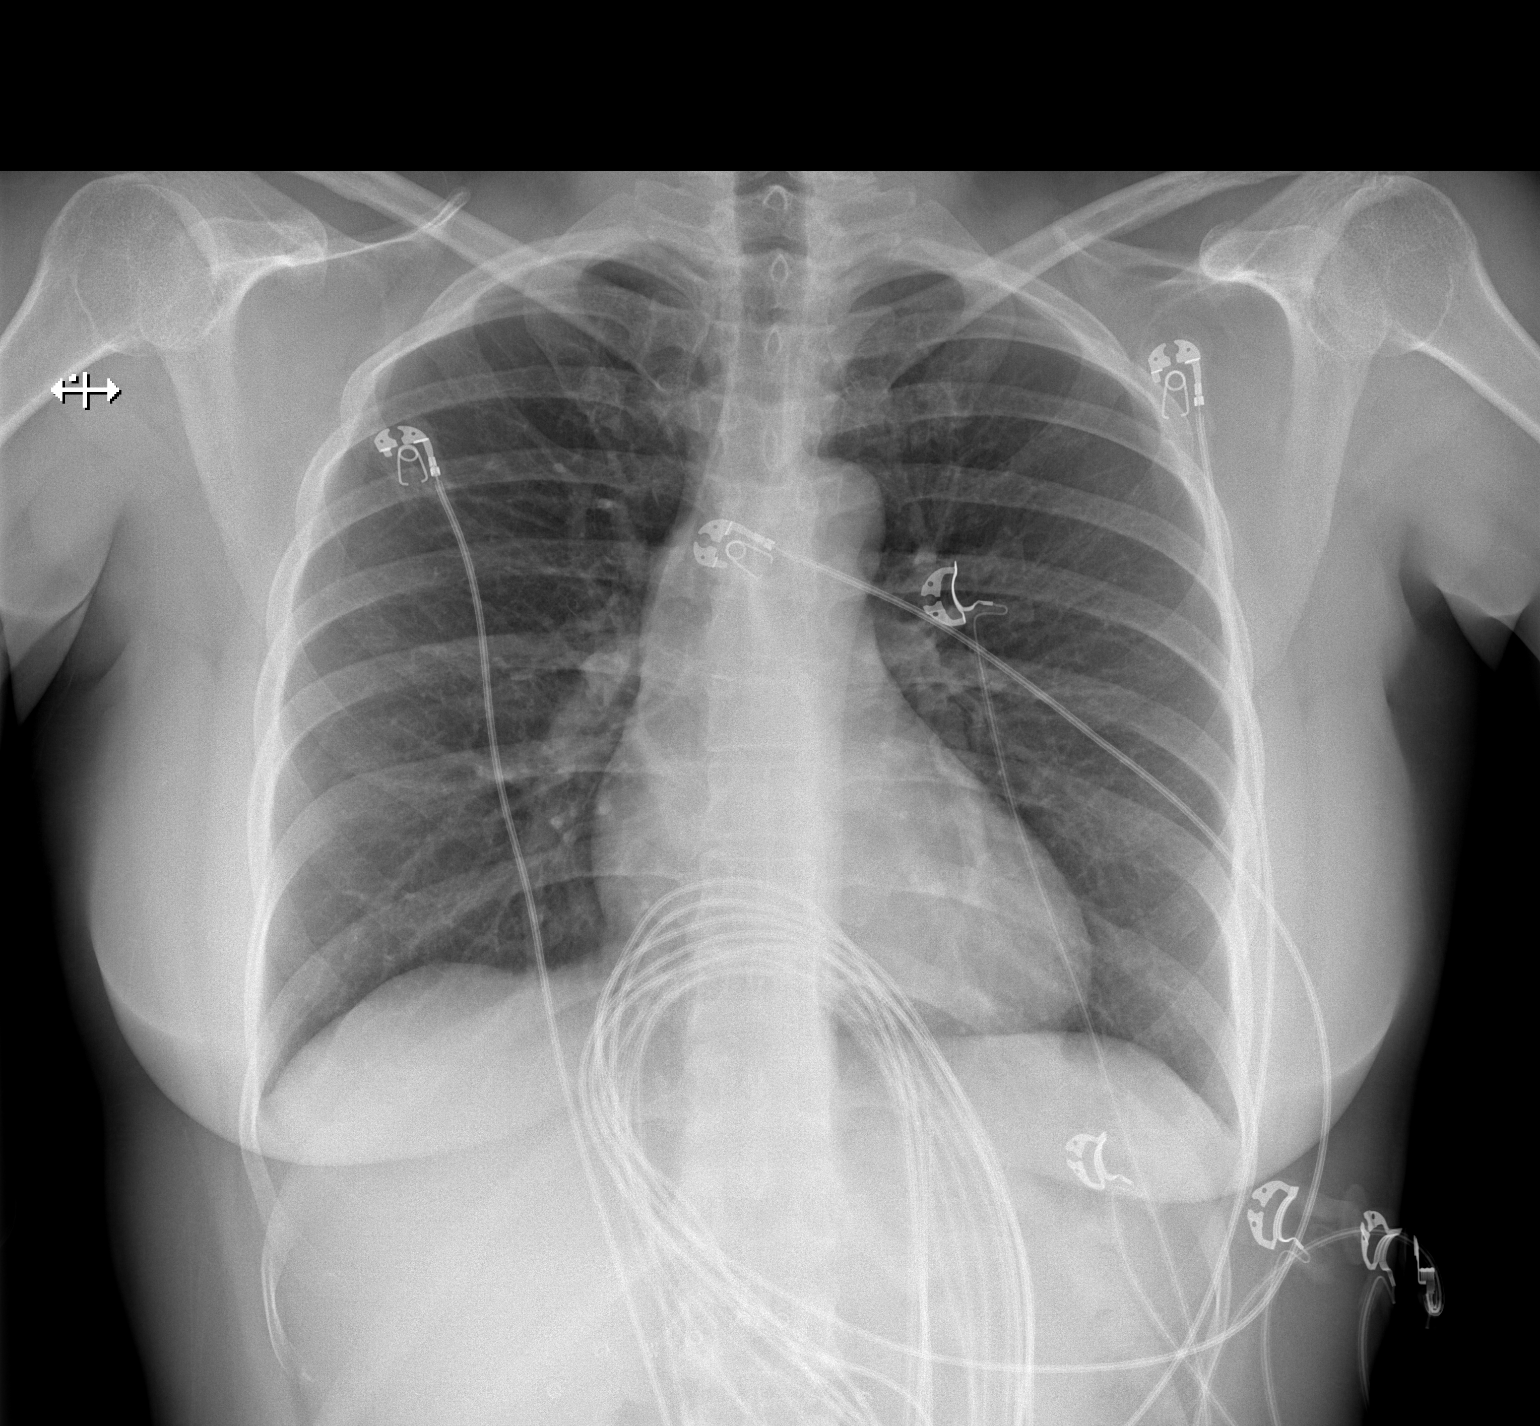

[w chest lat]
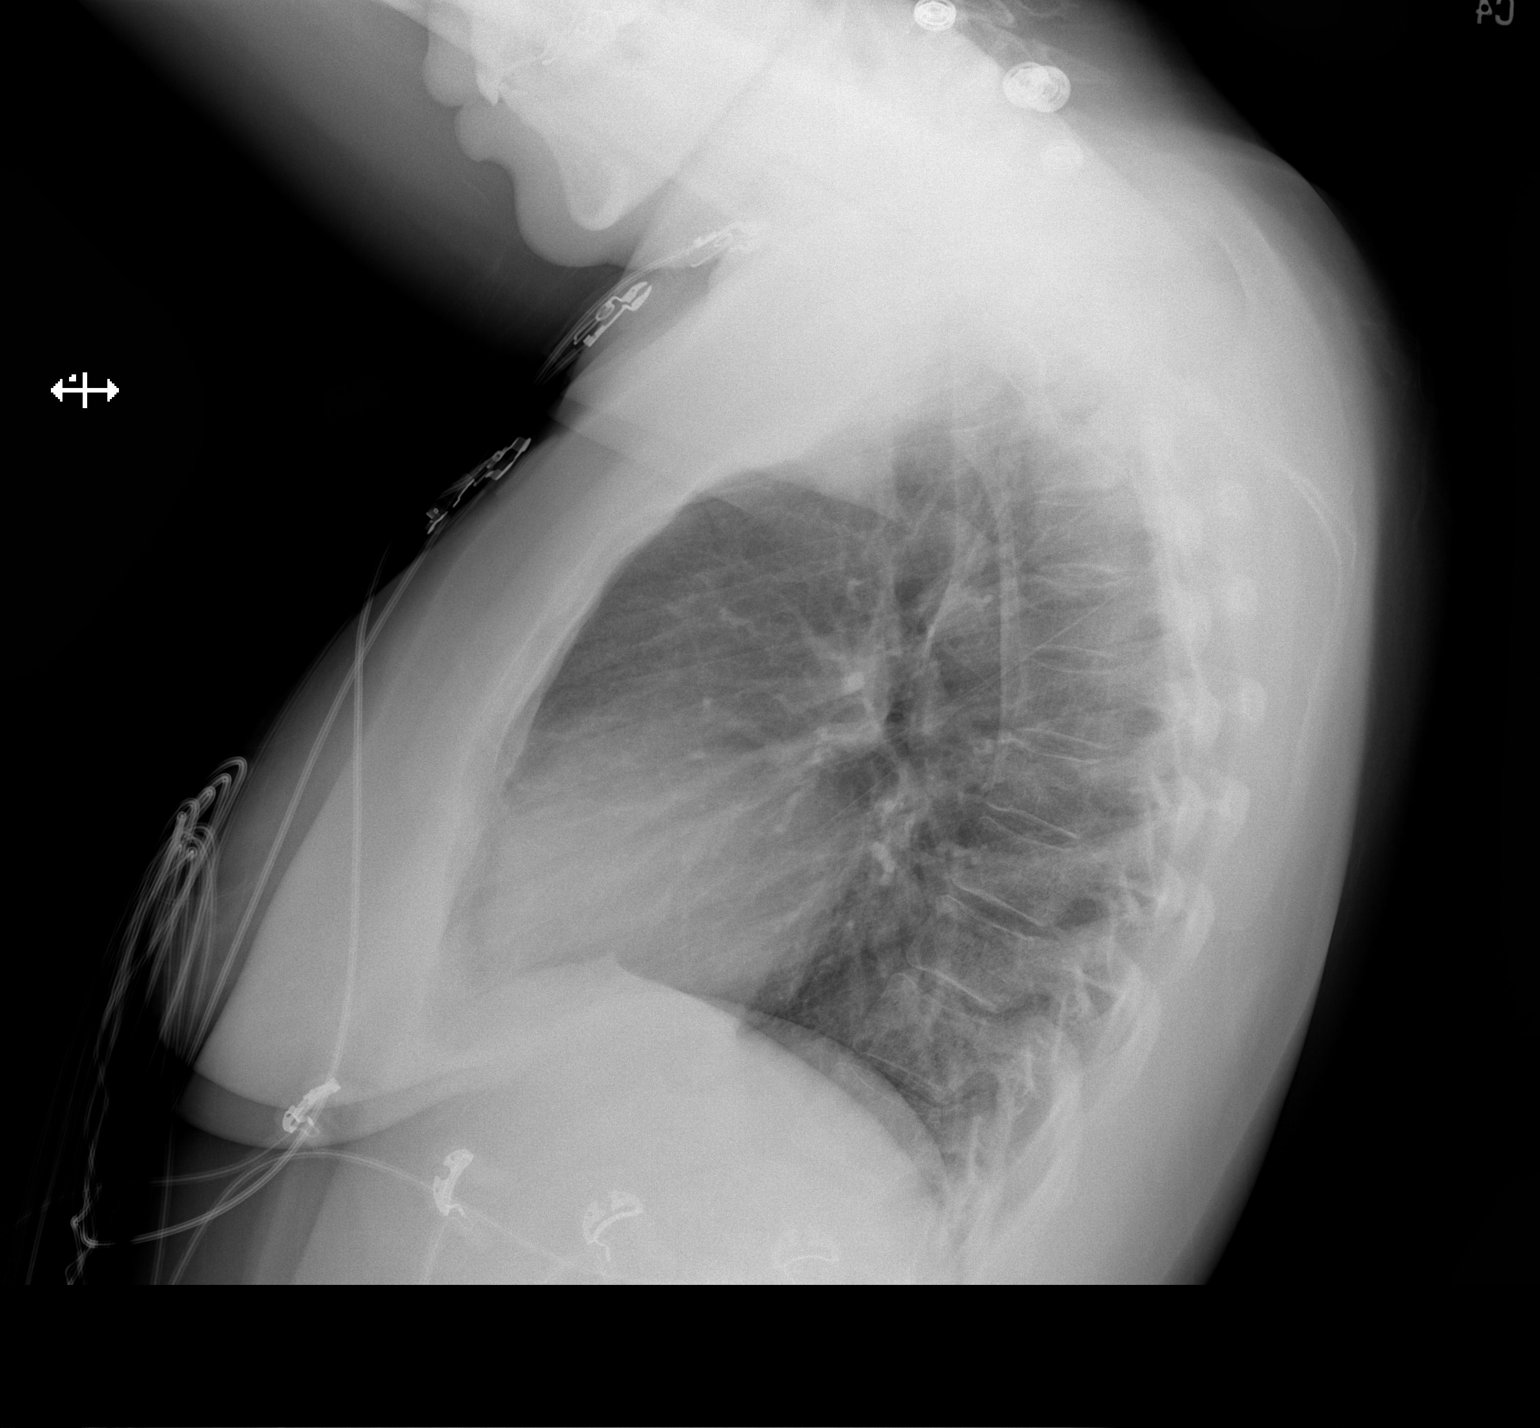

[2 of 2 positions shown; findings below may reference images not displayed]

FINDINGS: The heart size and mediastinal contours are within normal limits.
Both lungs are clear. The visualized skeletal structures are
unremarkable.
IMPRESSION: No acute abnormality of the lungs.

## 2020-04-28 ENCOUNTER — Other Ambulatory Visit: Payer: Self-pay

## 2020-04-28 ENCOUNTER — Encounter (HOSPITAL_BASED_OUTPATIENT_CLINIC_OR_DEPARTMENT_OTHER): Payer: Self-pay | Admitting: Emergency Medicine

## 2020-04-28 ENCOUNTER — Emergency Department (HOSPITAL_BASED_OUTPATIENT_CLINIC_OR_DEPARTMENT_OTHER)
Admission: EM | Admit: 2020-04-28 | Discharge: 2020-04-28 | Disposition: A | Discharge status: Home / Self Care | Payer: Managed Care, Other (non HMO) | Attending: Emergency Medicine | Admitting: Emergency Medicine

## 2020-04-28 DIAGNOSIS — S0990XA Unspecified injury of head, initial encounter: Secondary | ICD-10-CM | POA: Diagnosis present

## 2020-04-28 DIAGNOSIS — E119 Type 2 diabetes mellitus without complications: Secondary | ICD-10-CM | POA: Insufficient documentation

## 2020-04-28 DIAGNOSIS — W108XXA Fall (on) (from) other stairs and steps, initial encounter: Secondary | ICD-10-CM | POA: Diagnosis not present

## 2020-04-28 DIAGNOSIS — Y9301 Activity, walking, marching and hiking: Secondary | ICD-10-CM | POA: Diagnosis not present

## 2020-04-28 DIAGNOSIS — I10 Essential (primary) hypertension: Secondary | ICD-10-CM | POA: Diagnosis not present

## 2020-04-28 DIAGNOSIS — Z79899 Other long term (current) drug therapy: Secondary | ICD-10-CM | POA: Insufficient documentation

## 2020-04-28 NOTE — ED Triage Notes (Signed)
Pt reports fall today while climbing steps, states she tripped and hit her forehead yesterday. Dizziness, nausea, headaches.

## 2020-04-28 NOTE — ED Provider Notes (Signed)
[facility] EMERGENCY DEPARTMENT Provider Note  CSN: [id number] Arrival date & time: 04/28/20 2249  Chief Complaint(s) Fall  HPI Paula Robertson is a 40 y.o. female   The history is provided by the patient.  Fall This is a new problem. The current episode started yesterday. Episode frequency: once while walking up the stairs, she slipped and hit head on top step. Associated symptoms include headaches (intermittent. improves with motrin.). Pertinent negatives include no chest pain, no abdominal pain and no shortness of breath. Nothing aggravates the symptoms. Nothing relieves the symptoms. She has tried nothing for the symptoms.   Is having brief episodes of intermittent dizziness and nausea, lasing seconds and self resolve.  Past Medical History Past Medical History:  Diagnosis Date  . Diabetes mellitus without complication (HCC)   . Hypertension   . Migraines    There are no problems to display for this patient.  Home Medication(s) Prior to Admission medications   Medication Sig Start Date End Date Taking? Authorizing Provider  benazepril (LOTENSIN) 10 MG tablet Take 1 tablet (10 mg total) by mouth daily. 12/12/19   Molpus, John, MD  ondansetron (ZOFRAN ODT) 4 MG disintegrating tablet Take 1 tablet (4 mg total) by mouth every 8 (eight) hours as needed. 10/17/19   Long, Arlyss Repress, MD                                                                                                                                    Past Surgical History Past Surgical History:  Procedure Laterality Date  . HERNIA REPAIR    . TUBAL LIGATION     Family History Family History  Problem Relation Age of Onset  . Stroke Mother   . Heart failure Father   . Heart attack Father     Social History Social History   Tobacco Use  . Smoking status: Never Smoker  . Smokeless tobacco: Never Used  Vaping Use  . Vaping Use: Never used  Substance Use Topics  . Alcohol use: Yes    Comment: occ   . Drug use: No   Allergies Toradol [ketorolac tromethamine] and Naproxen  Review of Systems Review of Systems  Respiratory: Negative for shortness of breath.   Cardiovascular: Negative for chest pain.  Gastrointestinal: Negative for abdominal pain.  Neurological: Positive for headaches (intermittent. improves with motrin.).   All other systems are reviewed and are negative for acute change except as noted in the HPI  Physical Exam Vital Signs  I have reviewed the triage vital signs BP (!) 153/84   Pulse 79   Temp 99.2 F (37.3 C) (Oral)   Resp 14   LMP 04/23/2020   SpO2 99%   Physical Exam Constitutional:      General: She is not in acute distress.    Appearance: She is well-developed. She is not diaphoretic.  HENT:     Head: Normocephalic and atraumatic.  Right Ear: External ear normal.     Left Ear: External ear normal.     Nose: Nose normal.  Eyes:     General: No scleral icterus.       Right eye: No discharge.        Left eye: No discharge.     Conjunctiva/sclera: Conjunctivae normal.     Pupils: Pupils are equal, round, and reactive to light.  Cardiovascular:     Rate and Rhythm: Normal rate and regular rhythm.     Pulses:          Radial pulses are 2+ on the right side and 2+ on the left side.       Dorsalis pedis pulses are 2+ on the right side and 2+ on the left side.     Heart sounds: Normal heart sounds. No murmur heard. No friction rub. No gallop.   Pulmonary:     Effort: Pulmonary effort is normal. No respiratory distress.     Breath sounds: Normal breath sounds. No stridor. No wheezing.  Abdominal:     General: There is no distension.     Palpations: Abdomen is soft.     Tenderness: There is no abdominal tenderness.  Musculoskeletal:        General: No tenderness.     Cervical back: Normal range of motion and neck supple. No bony tenderness.     Thoracic back: No bony tenderness.     Lumbar back: No bony tenderness.     Comments: Clavicles  stable. Chest stable to AP/Lat compression. Pelvis stable to Lat compression. No obvious extremity deformity. No chest or abdominal wall contusion.  Skin:    General: Skin is warm and dry.     Findings: No erythema or rash.  Neurological:     Mental Status: She is alert and oriented to person, place, and time.     Comments: Mental Status:  Alert and oriented to person, place, and time.  Attention and concentration normal.  Speech clear.  Recent memory is intact  Cranial Nerves:  II Visual Fields: Intact to confrontation. Visual fields intact. III, IV, VI: Pupils equal and reactive to light and near. Full eye movement without nystagmus  V Facial Sensation: Normal. No weakness of masticatory muscles  VII: No facial weakness or asymmetry  VIII Auditory Acuity: Grossly normal  IX/X: The uvula is midline; the palate elevates symmetrically  XI: Normal sternocleidomastoid and trapezius strength  XII: The tongue is midline. No atrophy or fasciculations.   Motor System: Muscle Strength: 5/5 and symmetric in the upper and lower extremities. No pronation or drift.  Muscle Tone: Tone and muscle bulk are normal in the upper and lower extremities.   Coordination: Intact finger-to-nose. No tremor.  Sensation: Intact to light touch.  Gait: Routine gait normal.      ED Results and Treatments Labs (all labs ordered are listed, but only abnormal results are displayed) Labs Reviewed - No data to display  EKG  EKG Interpretation  Date/Time:    Ventricular Rate:    PR Interval:    QRS Duration:   QT Interval:    QTC Calculation:   R Axis:     Text Interpretation:        Radiology No results found.  Pertinent labs & imaging results that were available during my care of the patient were reviewed by me and considered in my medical decision making (see chart for  details).  Medications Ordered in ED Medications - No data to display                                                                                                                                  Procedures Procedures  (including critical care time)  Medical Decision Making / ED Course I have reviewed the nursing notes for this encounter and the patient's prior records (if available in EHR or on provided paperwork).   Paula Robertson was evaluated in Emergency Department on 04/28/2020 for the symptoms described in the history of present illness. She was evaluated in the context of the global COVID-19 pandemic, which necessitated consideration that the patient might be at risk for infection with the SARS-CoV-2 virus that causes COVID-19. Institutional protocols and algorithms that pertain to the evaluation of patients at risk for COVID-19 are in a state of rapid change based on information released by regulatory bodies including the CDC and federal and state organizations. These policies and algorithms were followed during the patient's care in the ED.  Minor closed head injury. Normal neuro exam. no anticoagulation. no indication for imaging at this time.   Doubt intracranial bleed or skull fracture.   Pt given information on head trauma including strict instructions to return for nausea/vomiting, confusion, altered level of consciousness or new weakness. Pt and family understand and are agreeable to the plan.        Final Clinical Impression(s) / ED Diagnoses Final diagnoses:  Minor head injury, initial encounter   The patient appears reasonably screened and/or stabilized for discharge and I doubt any other medical condition or other [facility] requiring further screening, evaluation, or treatment in the ED at this time prior to discharge. Safe for discharge with strict return precautions.  Disposition: Discharge  Condition: Good  I have discussed the results, Dx and Tx plan with the  patient/family who expressed understanding and agree(s) with the plan. Discharge instructions discussed at length. The patient/family was given strict return precautions who verbalized understanding of the instructions. No further questions at time of discharge.    ED Discharge Orders    None       Follow Up: Renaye Rakers, MD [address] [place] [address] [phone]  Call  to schedule an appointment for close follow up      This chart was dictated using voice recognition software.  Despite best efforts to proofread,  errors can occur which  can change the documentation meaning.   Nira Connardama, Wenonah Milo Eduardo, MD 04/28/20 2340

## 2020-04-28 NOTE — ED Notes (Signed)
ED Provider at bedside. 

## 2020-06-11 ENCOUNTER — Ambulatory Visit: Payer: Managed Care, Other (non HMO) | Admitting: Family

## 2021-05-04 ENCOUNTER — Emergency Department (HOSPITAL_BASED_OUTPATIENT_CLINIC_OR_DEPARTMENT_OTHER)
Admission: EM | Admit: 2021-05-04 | Discharge: 2021-05-05 | Disposition: A | Discharge status: Home / Self Care | Payer: Self-pay | Attending: Emergency Medicine | Admitting: Emergency Medicine

## 2021-05-04 ENCOUNTER — Other Ambulatory Visit: Payer: Self-pay

## 2021-05-04 ENCOUNTER — Encounter (HOSPITAL_BASED_OUTPATIENT_CLINIC_OR_DEPARTMENT_OTHER): Payer: Self-pay | Admitting: *Deleted

## 2021-05-04 DIAGNOSIS — Z79899 Other long term (current) drug therapy: Secondary | ICD-10-CM | POA: Insufficient documentation

## 2021-05-04 DIAGNOSIS — R739 Hyperglycemia, unspecified: Secondary | ICD-10-CM

## 2021-05-04 DIAGNOSIS — E119 Type 2 diabetes mellitus without complications: Secondary | ICD-10-CM | POA: Insufficient documentation

## 2021-05-04 DIAGNOSIS — I10 Essential (primary) hypertension: Secondary | ICD-10-CM | POA: Insufficient documentation

## 2021-05-04 NOTE — ED Triage Notes (Signed)
Pt reports BP has been reading high at home with her wrist cuff (200/103). Also c/o blurry vision yesterday, headache, and chest pain ?

## 2021-05-05 LAB — BASIC METABOLIC PANEL
Anion gap: 7 (ref 5–15)
BUN: 17 mg/dL (ref 6–20)
CO2: 24 mmol/L (ref 22–32)
Calcium: 8.8 mg/dL — ABNORMAL LOW (ref 8.9–10.3)
Chloride: 105 mmol/L (ref 98–111)
Creatinine, Ser: 0.87 mg/dL (ref 0.44–1.00)
GFR, Estimated: >60 mL/min (ref >60)
Glucose, Bld: 208 mg/dL — ABNORMAL HIGH (ref 70–99)
Potassium: 3.3 mmol/L — ABNORMAL LOW (ref 3.5–5.1)
Sodium: 136 mmol/L (ref 135–145)

## 2021-05-05 MED ORDER — BENAZEPRIL HCL 10 MG PO TABS: 10.0000 mg | ORAL_TABLET | Freq: Every day | ORAL | 0 refills | Status: DC

## 2021-05-05 MED ORDER — AMLODIPINE BESYLATE 5 MG PO TABS
5.0000 mg | ORAL_TABLET | Freq: Once | ORAL | Status: AC
  Administered 2021-05-05: 5 mg via ORAL
  Filled 2021-05-05: qty 1

## 2021-05-05 MED ORDER — AMLODIPINE BESYLATE 5 MG PO TABS: 5.0000 mg | ORAL_TABLET | Freq: Every day | ORAL | 0 refills | Status: DC

## 2021-05-05 MED ORDER — METFORMIN HCL 500 MG PO TABS: 500.0000 mg | ORAL_TABLET | Freq: Two times a day (BID) | ORAL | 1 refills | Status: DC

## 2021-05-05 NOTE — ED Notes (Signed)
Patient discharged to home.  All discharge instructions reviewed.  Patient verbalized understanding via teachback method.  VS WDL.  Respirations even and unlabored.  Ambulatory out of ED.   °

## 2021-05-05 NOTE — ED Provider Notes (Signed)
?[facility] EMERGENCY DEPARTMENT ?Provider Note ? ? ?CSN: [id number] ?Arrival date & time: 05/04/21  2257 ? ?  ? ?History ? ?Chief Complaint  ?Patient presents with  ? Hypertension  ? ? ?Paula Robertson is a 41 y.o. female. ? ?41 year old female who presents the emergency department today secondary to concerns for high blood pressure.  Patient has a history of high blood pressure is on benazepril.  Has been taking that as she supposed to.  Does have a primary care provider at this time.  She states that it is too expensive without insurance.  She states that she is been noticing some mild headaches over the last couple days and this has been checking her blood pressures and they range anywhere from [id number] systolic.  She is worried about this agrees to her further evaluation.  She states that she also has a history of diabetes but not currently on medication.  Has not been checking her blood sugars at home for ? ? ?Hypertension ? ? ?  ? ?Home Medications ?Prior to Admission medications   ?Medication Sig Start Date End Date Taking? Authorizing Provider  ?benazepril (LOTENSIN) 10 MG tablet Take 1 tablet (10 mg total) by mouth daily. 12/12/19   Molpus, John, MD  ?ondansetron (ZOFRAN ODT) 4 MG disintegrating tablet Take 1 tablet (4 mg total) by mouth every 8 (eight) hours as needed. 10/17/19   Long, Wonda Olds, MD  ?   ? ?Allergies    ?Toradol [ketorolac tromethamine] and Naproxen   ? ?Review of Systems   ?Review of Systems ? ?Physical Exam ?Updated Vital Signs ?BP 136/65   Pulse 72   Temp 98.3 ?F (36.8 ?C) (Oral)   Resp 20   Ht 5\' 7"  (1.702 m)   Wt 89.4 kg   LMP 04/09/2021 (Approximate)   SpO2 99%   BMI 30.85 kg/m?  ?Physical Exam ?Vitals and nursing note reviewed.  ?Constitutional:   ?   Appearance: She is well-developed.  ?HENT:  ?   Head: Normocephalic and atraumatic.  ?   Mouth/Throat:  ?   Mouth: Mucous membranes are moist.  ?   Pharynx: Oropharynx is clear.  ?Eyes:  ?   Pupils: Pupils are equal, round,  and reactive to light.  ?Cardiovascular:  ?   Rate and Rhythm: Normal rate and regular rhythm.  ?Pulmonary:  ?   Effort: No respiratory distress.  ?   Breath sounds: No stridor. No wheezing.  ?Abdominal:  ?   General: Abdomen is flat. There is no distension.  ?Musculoskeletal:     ?   General: No swelling or tenderness. Normal range of motion.  ?   Cervical back: Normal range of motion.  ?Skin: ?   General: Skin is warm and dry.  ?Neurological:  ?   General: No focal deficit present.  ?   Mental Status: She is alert.  ? ? ?ED Results / Procedures / Treatments   ?Labs ?(all labs ordered are listed, but only abnormal results are displayed) ?Labs Reviewed  ?BASIC METABOLIC PANEL - Abnormal; Notable for the following components:  ?    Result Value  ? Potassium 3.3 (*)   ? Glucose, Bld 208 (*)   ? Calcium 8.8 (*)   ? All other components within normal limits  ? ? ?EKG ?EKG Interpretation ? ?Date/Time:  Sunday May 04 2021 23:06:11 EDT ?Ventricular Rate:  83 ?PR Interval:  155 ?QRS Duration: 83 ?QT Interval:  410 ?QTC Calculation: 482 ?R Axis:  45 ?Text Interpretation: Sinus rhythm LAE, consider biatrial enlargement Borderline T abnormalities, anterior leads Confirmed by Merrily Pew [phone]) on 05/05/2021 12:07:03 AM ? ?Radiology ?No results found. ? ?Procedures ?Procedures  ? ? ?Medications Ordered in ED ?Medications  ?amLODipine (NORVASC) tablet 5 mg (5 mg Oral Given 05/05/21 0047)  ? ? ?ED Course/ Medical Decision Making/ A&P ?  ?                        ?Medical Decision Making ?Amount and/or Complexity of Data Reviewed ?Labs: ordered. ? ?Risk ?Prescription drug management. ? ? ?No evidence of endorgan damage secondary to high blood pressure.  She does appear to still have diabetes.  She states that she should have insurance and doctors appointment at the beginning of next month so we will go ahead and initiate some Norvasc since her kidney function is fine also we will go and reinitiate metformin.  We will give her  49-month prescription overlap any possible delays in getting in with a primary doctor. ? ? ?Final Clinical Impression(s) / ED Diagnoses ?Final diagnoses:  ?None  ? ? ?Rx / DC Orders ?ED Discharge Orders   ? ? None  ? ?  ? ? ?  ?Merrily Pew, MD ?05/05/21 [id number] ? ?

## 2021-08-26 ENCOUNTER — Emergency Department (HOSPITAL_BASED_OUTPATIENT_CLINIC_OR_DEPARTMENT_OTHER)
Admission: EM | Admit: 2021-08-26 | Discharge: 2021-08-26 | Disposition: A | Discharge status: Home / Self Care | Payer: Commercial Managed Care - PPO | Attending: Emergency Medicine | Admitting: Emergency Medicine

## 2021-08-26 ENCOUNTER — Emergency Department (HOSPITAL_BASED_OUTPATIENT_CLINIC_OR_DEPARTMENT_OTHER): Payer: Commercial Managed Care - PPO

## 2021-08-26 ENCOUNTER — Other Ambulatory Visit: Payer: Self-pay

## 2021-08-26 ENCOUNTER — Encounter (HOSPITAL_BASED_OUTPATIENT_CLINIC_OR_DEPARTMENT_OTHER): Payer: Self-pay

## 2021-08-26 DIAGNOSIS — E119 Type 2 diabetes mellitus without complications: Secondary | ICD-10-CM | POA: Insufficient documentation

## 2021-08-26 DIAGNOSIS — I471 Supraventricular tachycardia: Secondary | ICD-10-CM | POA: Insufficient documentation

## 2021-08-26 DIAGNOSIS — I10 Essential (primary) hypertension: Secondary | ICD-10-CM | POA: Diagnosis not present

## 2021-08-26 DIAGNOSIS — R079 Chest pain, unspecified: Secondary | ICD-10-CM | POA: Diagnosis present

## 2021-08-26 LAB — BASIC METABOLIC PANEL
Anion gap: 8 (ref 5–15)
BUN: 10 mg/dL (ref 6–20)
CO2: 24 mmol/L (ref 22–32)
Calcium: 8.5 mg/dL — ABNORMAL LOW (ref 8.9–10.3)
Chloride: 106 mmol/L (ref 98–111)
Creatinine, Ser: 0.85 mg/dL (ref 0.44–1.00)
GFR, Estimated: >60 mL/min (ref >60)
Glucose, Bld: 219 mg/dL — ABNORMAL HIGH (ref 70–99)
Potassium: 3.6 mmol/L (ref 3.5–5.1)
Sodium: 138 mmol/L (ref 135–145)

## 2021-08-26 LAB — CBC WITH DIFFERENTIAL/PLATELET
Abs Immature Granulocytes: 0.02 10*3/uL (ref 0.00–0.07)
Basophils Absolute: 0 10*3/uL (ref 0.0–0.1)
Basophils Relative: 0 %
Eosinophils Absolute: 0.3 10*3/uL (ref 0.0–0.5)
Eosinophils Relative: 3 %
HCT: 37.8 % (ref 36.0–46.0)
Hemoglobin: 12.5 g/dL (ref 12.0–15.0)
Immature Granulocytes: 0 %
Lymphocytes Relative: 47 %
Lymphs Abs: 4.6 10*3/uL — ABNORMAL HIGH (ref 0.7–4.0)
MCH: 28.6 pg (ref 26.0–34.0)
MCHC: 33.1 g/dL (ref 30.0–36.0)
MCV: 86.5 fL (ref 80.0–100.0)
Monocytes Absolute: 0.8 10*3/uL (ref 0.1–1.0)
Monocytes Relative: 8 %
Neutro Abs: 4.1 10*3/uL (ref 1.7–7.7)
Neutrophils Relative %: 42 %
Platelets: 381 10*3/uL (ref 150–400)
RBC: 4.37 MIL/uL (ref 3.87–5.11)
RDW: 13 % (ref 11.5–15.5)
WBC: 9.7 10*3/uL (ref 4.0–10.5)
nRBC: 0 % (ref 0.0–0.2)

## 2021-08-26 LAB — MAGNESIUM: Magnesium: 1.6 mg/dL — ABNORMAL LOW (ref 1.7–2.4)

## 2021-08-26 LAB — HCG, QUANTITATIVE, PREGNANCY: hCG, Beta Chain, Quant, S: 1 m[IU]/mL (ref <5)

## 2021-08-26 MED ORDER — MAGNESIUM SULFATE 2 GM/50ML IV SOLN
2.0000 g | Freq: Once | INTRAVENOUS | Status: AC
  Administered 2021-08-26: 2 g via INTRAVENOUS
  Filled 2021-08-26: qty 50

## 2021-08-26 MED ORDER — LACOSAMIDE 50 MG PO TABS: 50.0000 mg | ORAL_TABLET | Freq: Once | ORAL | Status: DC

## 2021-08-26 MED ORDER — VERAPAMIL HCL 2.5 MG/ML IV SOLN
10.0000 mg | Freq: Once | INTRAVENOUS | Status: AC
  Administered 2021-08-26: 10 mg via INTRAVENOUS
  Filled 2021-08-26: qty 4

## 2021-08-26 MED ORDER — LACTATED RINGERS IV BOLUS
1000.0000 mL | Freq: Once | INTRAVENOUS | Status: AC
  Administered 2021-08-26: 1000 mL via INTRAVENOUS

## 2021-08-26 MED ORDER — BRIVARACETAM 25 MG PO TABS: 50.0000 mg | ORAL_TABLET | Freq: Once | ORAL | Status: DC

## 2021-08-26 NOTE — ED Notes (Signed)
Patient Alert and oriented to baseline. Stable and ambulatory to baseline. Patient verbalized understanding of the discharge instructions.  Patient belongings were taken by the patient.   

## 2021-08-26 NOTE — ED Provider Notes (Signed)
[facility] EMERGENCY DEPARTMENT Provider Note   CSN: [id number] Arrival date & time: 08/26/21  6789     History  Chief Complaint  Patient presents with   Chest Pain    Alethea Terhaar is a 41 y.o. female.  41 yo F here with chest tightness since she woke up from sleep. Palpitations. No new meds. On amlodipine for HTN, no beta blockers at home. No new caffeine intake, OTC meds, recent illnesses or other associated symptoms.  No fever. No h/o same.    Chest Pain Pain location:  Substernal area Pain quality: tightness   Pain radiates to:  Does not radiate Pain severity:  Mild Associated symptoms: palpitations   Palpitations Associated symptoms: chest pain        Home Medications Prior to Admission medications   Medication Sig Start Date End Date Taking? Authorizing Provider  amLODipine (NORVASC) 5 MG tablet Take 1 tablet (5 mg total) by mouth daily. 05/05/21   Anthonella Klausner, Barbara Cower, MD  benazepril (LOTENSIN) 10 MG tablet Take 1 tablet (10 mg total) by mouth daily. 05/05/21   Shamira Toutant, Barbara Cower, MD  metFORMIN (GLUCOPHAGE) 500 MG tablet Take 1 tablet (500 mg total) by mouth 2 (two) times daily with a meal. 05/05/21 07/04/21  Fleming Prill, Barbara Cower, MD  ondansetron (ZOFRAN ODT) 4 MG disintegrating tablet Take 1 tablet (4 mg total) by mouth every 8 (eight) hours as needed. 10/17/19   Long, Arlyss Repress, MD      Allergies    Toradol [ketorolac tromethamine] and Naproxen    Review of Systems   Review of Systems  Cardiovascular:  Positive for chest pain and palpitations.    Physical Exam Updated Vital Signs BP (!) 179/121 (BP Location: Right Arm)   Pulse (!) 191   Temp 98.9 F (37.2 C) (Oral)   Resp 20   Ht 5\' 7"  (1.702 m)   Wt 88 kg   SpO2 100%   BMI 30.38 kg/m  Physical Exam Vitals and nursing note reviewed.  Constitutional:      Appearance: She is well-developed.  HENT:     Head: Normocephalic and atraumatic.     Mouth/Throat:     Mouth: Mucous membranes are moist.      Pharynx: Oropharynx is clear.  Eyes:     Pupils: Pupils are equal, round, and reactive to light.  Cardiovascular:     Rate and Rhythm: Regular rhythm. Tachycardia present.  Pulmonary:     Effort: No respiratory distress.     Breath sounds: No stridor.  Abdominal:     General: Abdomen is flat. There is no distension.  Musculoskeletal:        General: Normal range of motion.     Cervical back: Normal range of motion.  Skin:    General: Skin is warm and dry.  Neurological:     General: No focal deficit present.     Mental Status: She is alert.     ED Results / Procedures / Treatments   Labs (all labs ordered are listed, but only abnormal results are displayed) Labs Reviewed  CBC WITH DIFFERENTIAL/PLATELET  BASIC METABOLIC PANEL  MAGNESIUM  HCG, QUANTITATIVE, PREGNANCY    EKG None  Radiology No results found.  Procedures .Critical Care  Performed by: , MD Authorized by: Marily Memos, MD   Critical care provider statement:    Critical care time (minutes):  32   Critical care time was exclusive of:  Separately billable procedures and treating other patients and teaching time  Critical care was necessary to treat or prevent imminent or life-threatening deterioration of the following conditions:  Circulatory failure   Critical care was time spent personally by me on the following activities:  Development of treatment plan with patient or surrogate, evaluation of patient's response to treatment, examination of patient, obtaining history from patient or surrogate, review of old charts, re-evaluation of patient's condition, pulse oximetry, ordering and performing treatments and interventions and ordering and review of laboratory studies     Medications Ordered in ED Medications  lactated ringers bolus 1,000 mL (has no administration in time range)  verapamil (ISOPTIN) injection 10 mg (10 mg Intravenous Given 08/26/21 4431)    ED Course/ Medical Decision  Making/ A&P                           Medical Decision Making Amount and/or Complexity of Data Reviewed Labs: ordered. Radiology: ordered.  Risk Prescription drug management.   ECG c/w SVT. Will check electrolytes. Attempted vagal maneuvers without success. Will try verapamil in lieu of adenosine. No indication to check troponins unless pain persists after cardioversion. Doesn't appear to be afib, no indication for anticoagulation at this time.  Patietn converted after verapamil. Mg low, ordered. Still on monitor. Pending reeval for likely discharge at time of change of shift.     Final Clinical Impression(s) / ED Diagnoses Final diagnoses:  None    Rx / DC Orders ED Discharge Orders     None         Sherry Blackard, Barbara Cower, MD 08/28/21 [phone]

## 2021-08-26 NOTE — ED Provider Notes (Signed)
  Provider Note MRN:  [id number]  Arrival date & time: 08/26/21    ED Course and Medical Decision Making  Assumed care from [facility] at shift change.  See not from prior team for complete details, in brief:  41 yo female To ED with CP/SVT Pt felt initially it was a panic attack but did not resolve so reported to ED No hx of SVT Resolved after verapamil Mag low, replaced  Plan per prior physician f/u labs, re-assess  Labs and x-ray reviewed, I reviewed images and interpreted them.  No large pneumothorax or consolidation, I agree with radiologist.  Magnesium mildly depleted.  Glucose mildly elevated.  No anion gap.  Labs otherwise are unremarkable.  She had chest pain on arrival, she has DM and HTN; otherwise no sig risk factors for CAD  Patient symptoms have improved. No rpt episodes of SVT. Stable for discharge.   Encouraged follow up with PCP and cardiology.   The patient improved significantly and was discharged in stable condition. Detailed discussions were had with the patient regarding current findings, and need for close f/u with PCP or on call doctor. The patient has been instructed to return immediately if the symptoms worsen in any way for re-evaluation. Patient verbalized understanding and is in agreement with current care plan. All questions answered prior to discharge.   Procedures  Final Clinical Impressions(s) / ED Diagnoses     ICD-10-CM   1. SVT (supraventricular tachycardia) (HCC)  I47.1 Ambulatory referral to Cardiology    2. Hypomagnesemia  E83.42       ED Discharge Orders          Ordered    Ambulatory referral to Cardiology        08/26/21 0806              Discharge Instructions      It was a pleasure caring for you today in the emergency department.  Please return to the emergency department for any worsening or worrisome symptoms.  Please follow up with cardiology  Avoid stimulants such as caffeine, no alcohol use please, no tobacco.           Sloan Leiter, DO 08/26/21 [phone]

## 2021-08-26 NOTE — Discharge Instructions (Addendum)
It was a pleasure caring for you today in the emergency department.  Please return to the emergency department for any worsening or worrisome symptoms.  Please follow up with cardiology  Avoid stimulants such as caffeine, no alcohol use please, no tobacco.

## 2021-08-26 NOTE — ED Triage Notes (Signed)
Pt c/o chest pain that started 40 minutes ago. Pt describes pain as tightness.

## 2021-09-02 NOTE — Progress Notes (Unsigned)
   New Patient Office Visit  Subjective    Patient ID: Paula Robertson, female    DOB: 1980/11/07  Age: 41 y.o. MRN: [id number]  CC: No chief complaint on file.   HPI Paula Robertson presents for new patient visit to establish care.  Introduced to Publishing rights manager role and practice setting.  All questions answered.  Discussed provider/patient relationship and expectations.   Outpatient Encounter Medications as of 09/03/2021  Medication Sig   amLODipine (NORVASC) 5 MG tablet Take 1 tablet (5 mg total) by mouth daily.   benazepril (LOTENSIN) 10 MG tablet Take 1 tablet (10 mg total) by mouth daily.   metFORMIN (GLUCOPHAGE) 500 MG tablet Take 1 tablet (500 mg total) by mouth 2 (two) times daily with a meal.   ondansetron (ZOFRAN ODT) 4 MG disintegrating tablet Take 1 tablet (4 mg total) by mouth every 8 (eight) hours as needed.   No facility-administered encounter medications on file as of 09/03/2021.    Past Medical History:  Diagnosis Date   Diabetes mellitus without complication (HCC)    Hypertension    Migraines     Past Surgical History:  Procedure Laterality Date   HERNIA REPAIR     TUBAL LIGATION      Family History  Problem Relation Age of Onset   Stroke Mother    Heart failure Father    Heart attack Father     Social History   Socioeconomic History   Marital status: Married    Spouse name: Not on file   Number of children: Not on file   Years of education: Not on file   Highest education level: Not on file  Occupational History   Not on file  Tobacco Use   Smoking status: Never   Smokeless tobacco: Never  Vaping Use   Vaping Use: Never used  Substance and Sexual Activity   Alcohol use: Not Currently    Comment: occ   Drug use: No   Sexual activity: Not on file  Other Topics Concern   Not on file  Social History Narrative   Not on file   Social Determinants of Health   Financial Resource Strain: Not on file  Food Insecurity: Not on file   Transportation Needs: Not on file  Physical Activity: Not on file  Stress: Not on file  Social Connections: Not on file  Intimate Partner Violence: Not on file    ROS      Objective    LMP 07/24/2021   Physical Exam  {Labs (Optional):23779}    Assessment & Plan:   Problem List Items Addressed This Visit   None   No follow-ups on file.   Gerre Scull, NP

## 2021-09-03 ENCOUNTER — Encounter: Payer: Self-pay | Admitting: Nurse Practitioner

## 2021-09-03 ENCOUNTER — Ambulatory Visit (INDEPENDENT_AMBULATORY_CARE_PROVIDER_SITE_OTHER): Payer: Commercial Managed Care - PPO | Admitting: Nurse Practitioner

## 2021-09-03 ENCOUNTER — Telehealth: Payer: Self-pay | Admitting: Nurse Practitioner

## 2021-09-03 VITALS — BP 190/104 | HR 72 | Temp 96.6°F | Ht 67.0 in | Wt 198.4 lb

## 2021-09-03 DIAGNOSIS — I152 Hypertension secondary to endocrine disorders: Secondary | ICD-10-CM | POA: Insufficient documentation

## 2021-09-03 DIAGNOSIS — E1159 Type 2 diabetes mellitus with other circulatory complications: Secondary | ICD-10-CM | POA: Diagnosis not present

## 2021-09-03 DIAGNOSIS — G4700 Insomnia, unspecified: Secondary | ICD-10-CM

## 2021-09-03 DIAGNOSIS — I471 Supraventricular tachycardia: Secondary | ICD-10-CM | POA: Diagnosis not present

## 2021-09-03 DIAGNOSIS — E119 Type 2 diabetes mellitus without complications: Secondary | ICD-10-CM | POA: Diagnosis not present

## 2021-09-03 DIAGNOSIS — Z23 Encounter for immunization: Secondary | ICD-10-CM | POA: Diagnosis not present

## 2021-09-03 DIAGNOSIS — E1165 Type 2 diabetes mellitus with hyperglycemia: Secondary | ICD-10-CM | POA: Insufficient documentation

## 2021-09-03 LAB — TSH: TSH: 0.83 u[IU]/mL (ref 0.35–5.50)

## 2021-09-03 LAB — COMPREHENSIVE METABOLIC PANEL
ALT: 11 U/L (ref 0–35)
AST: 13 U/L (ref 0–37)
Albumin: 4.5 g/dL (ref 3.5–5.2)
Alkaline Phosphatase: 114 U/L (ref 39–117)
BUN: 12 mg/dL (ref 6–23)
CO2: 29 mEq/L (ref 19–32)
Calcium: 9.5 mg/dL (ref 8.4–10.5)
Chloride: 102 mEq/L (ref 96–112)
Creatinine, Ser: 0.86 mg/dL (ref 0.40–1.20)
GFR: 85.58 mL/min (ref >60.00)
Glucose, Bld: 179 mg/dL — ABNORMAL HIGH (ref 70–99)
Potassium: 3.7 mEq/L (ref 3.5–5.1)
Sodium: 140 mEq/L (ref 135–145)
Total Bilirubin: 0.7 mg/dL (ref 0.2–1.2)
Total Protein: 7.6 g/dL (ref 6.0–8.3)

## 2021-09-03 LAB — HEMOGLOBIN A1C: Hgb A1c MFr Bld: 8.2 % — ABNORMAL HIGH (ref 4.6–6.5)

## 2021-09-03 LAB — LIPID PANEL
Cholesterol: 236 mg/dL — ABNORMAL HIGH (ref 0–200)
HDL: 42.1 mg/dL (ref >39.00)
LDL Cholesterol: 167 mg/dL — ABNORMAL HIGH (ref 0–99)
NonHDL: 193.92
Total CHOL/HDL Ratio: 6
Triglycerides: 134 mg/dL (ref 0.0–149.0)
VLDL: 26.8 mg/dL (ref 0.0–40.0)

## 2021-09-03 LAB — MICROALBUMIN / CREATININE URINE RATIO
Creatinine,U: 219.7 mg/dL
Microalb Creat Ratio: 1.1 mg/g (ref 0.0–30.0)
Microalb, Ur: 2.4 mg/dL — ABNORMAL HIGH (ref 0.0–1.9)

## 2021-09-03 LAB — MAGNESIUM: Magnesium: 1.6 mg/dL (ref 1.5–2.5)

## 2021-09-03 MED ORDER — OZEMPIC (0.25 OR 0.5 MG/DOSE) 2 MG/1.5ML ~~LOC~~ SOPN: 0.2500 mg | PEN_INJECTOR | SUBCUTANEOUS | 3 refills | Status: DC

## 2021-09-03 MED ORDER — LOSARTAN POTASSIUM-HCTZ 50-12.5 MG PO TABS: 1.0000 | ORAL_TABLET | Freq: Every day | ORAL | 0 refills | Status: DC

## 2021-09-03 MED ORDER — TRAZODONE HCL 50 MG PO TABS: 25.0000 mg | ORAL_TABLET | Freq: Every evening | ORAL | 3 refills | Status: DC | PRN

## 2021-09-03 MED ORDER — MAGNESIUM OXIDE -MG SUPPLEMENT 200 MG PO TABS: 200.0000 mg | ORAL_TABLET | Freq: Two times a day (BID) | ORAL | 0 refills | Status: DC

## 2021-09-03 MED ORDER — VALSARTAN-HYDROCHLOROTHIAZIDE 160-25 MG PO TABS: 1.0000 | ORAL_TABLET | Freq: Every day | ORAL | 1 refills | Status: DC

## 2021-09-03 MED ORDER — BLOOD GLUCOSE MONITOR KIT: PACK | 0 refills | Status: AC

## 2021-09-03 MED ORDER — METFORMIN HCL ER 500 MG PO TB24
500.0000 mg | ORAL_TABLET | Freq: Two times a day (BID) | ORAL | 1 refills | Status: DC
Start: 2021-09-03 — End: 2022-06-26

## 2021-09-03 NOTE — Assessment & Plan Note (Addendum)
Chronic, not controlled.  BP today 190/104.  Originally was in a plan for her to start valsartan-hydrochlorothiazide, however her insurance does not cover this.  We will start losartan-hydrochlorothiazide 50-12.5 mg daily.  Most likely will need to increase this next visit.  Continue amlodipine 5 mg daily.  She is having some intermittent swelling in her feet, will hold off on increasing the amlodipine at this time.  We will check BMP today.  Follow-up in 2 weeks.

## 2021-09-03 NOTE — Patient Instructions (Signed)
It was great to see you!  We are checking your labs today and will let you know the results via mychart/phone.   Start ozempic once a week injection 0.25mg  weekly for 4 weeks, then increase to 0.5mg  weekly.   I have refilled your metformin and sent a glucometer to the pharmacy.   Start trazodone 1/2 tablet to 1 tablet at bedtime for sleep.   Start valsartan-hctz 1 tablet daily for your blood pressure in addition to the amlodipine. Check your blood pressure at home as well.   Let's follow-up in 2 weeks, sooner if you have concerns.  If a referral was placed today, you will be contacted for an appointment. Please note that routine referrals can sometimes take up to 3-4 weeks to process. Please call our office if you haven't heard anything after this time frame.  Take care,  Rodman Pickle, NP

## 2021-09-03 NOTE — Assessment & Plan Note (Signed)
She is not checking her blood sugars at home however in the ER her blood sugars were in the 200s.  We will refill her metformin 500 mg twice a day and have her start Ozempic 0.25 mg injection weekly for 4 weeks, then increase to 0.5 mg injection weekly.  Glucometer sent to the pharmacy so she can start checking her blood sugars.  She has an appointment with ophthalmology on next Saturday.  We will check A1c, urine microalbumin today.  Follow-up in 2 weeks

## 2021-09-03 NOTE — Telephone Encounter (Signed)
Pt is wanting a script that is similar to ozempic. Her insurance will not cover this and it costs to much money. Please advise pt at [phone]

## 2021-09-03 NOTE — Addendum Note (Signed)
Addended by: Rodman Pickle A on: 09/03/2021 03:15 PM   Modules accepted: Orders

## 2021-09-03 NOTE — Assessment & Plan Note (Signed)
She was recently diagnosed with SVT at the ER.  She ended up needing verapamil to break the SVT.  She has had a few episodes since then at home.  She has an appointment with cardiology on 09/16/2021, encouraged her to keep this appointment.  We will check a TSH today

## 2021-09-03 NOTE — Assessment & Plan Note (Signed)
Chronic, ongoing.  She states that she has been having some trouble sleeping and has tried over-the-counter sleep aids including melatonin.  We will have her start trazodone 25 to 50 mg as needed at bedtime.  Discussed that her GAD-7 was slightly elevated today and she might have anxiety that could be controlled with medication or therapy, however she declines at this point.  Follow-up in 2 weeks.

## 2021-09-05 ENCOUNTER — Encounter: Payer: Self-pay | Admitting: Nurse Practitioner

## 2021-09-09 MED ORDER — GLIPIZIDE 5 MG PO TABS: 5.0000 mg | ORAL_TABLET | Freq: Every day | ORAL | 2 refills | Status: DC

## 2021-09-09 NOTE — Telephone Encounter (Signed)
See response to mychart message.

## 2021-09-13 LAB — HM DIABETES EYE EXAM

## 2021-09-16 ENCOUNTER — Encounter: Payer: Self-pay | Admitting: Internal Medicine

## 2021-09-16 ENCOUNTER — Ambulatory Visit (INDEPENDENT_AMBULATORY_CARE_PROVIDER_SITE_OTHER): Payer: Commercial Managed Care - PPO | Admitting: Internal Medicine

## 2021-09-16 VITALS — BP 132/84 | HR 92 | Ht 67.0 in | Wt 199.8 lb

## 2021-09-16 DIAGNOSIS — E119 Type 2 diabetes mellitus without complications: Secondary | ICD-10-CM

## 2021-09-16 DIAGNOSIS — I152 Hypertension secondary to endocrine disorders: Secondary | ICD-10-CM

## 2021-09-16 DIAGNOSIS — Z79899 Other long term (current) drug therapy: Secondary | ICD-10-CM | POA: Diagnosis not present

## 2021-09-16 DIAGNOSIS — E1159 Type 2 diabetes mellitus with other circulatory complications: Secondary | ICD-10-CM | POA: Diagnosis not present

## 2021-09-16 DIAGNOSIS — I471 Supraventricular tachycardia: Secondary | ICD-10-CM

## 2021-09-16 MED ORDER — ROSUVASTATIN CALCIUM 20 MG PO TABS: 20.0000 mg | ORAL_TABLET | Freq: Every day | ORAL | 3 refills | Status: DC

## 2021-09-16 MED ORDER — DILTIAZEM HCL ER COATED BEADS 240 MG PO CP24: 240.0000 mg | ORAL_CAPSULE | Freq: Every day | ORAL | 3 refills | Status: DC

## 2021-09-16 NOTE — Progress Notes (Signed)
Cardiology Office Note   Date:  09/16/2021   ID:  Paula Robertson, DOB Dec 28, 1980, MRN [id number]  PCP:  Gerre Scull, NP  Cardiologist:   Dietrich Pates, MD   Pt referred from ED for evaluation of SVT   History of Present Illness: Paula Robertson is a 41 y.o. female with a history of HTN who presented to [facility] ED on 08/26/21   The pt says she woke up from sleep with her  heart racing,. She says her chest was tight.    The pt was  given verapamil and converted back to SR    The pt has had short spells before   Also, since ED visit she has had a couple spells   last one was on 7/23   Lasted about 20 min   WIth the spell she denies dizziness/syncope     When not having spells she says her breathing is OK  She denies chest pressure.    The pt had HTN noted in March.  She was seen in ED   Started on amlodipine       Losartan added     She has ah hx of DM   Sugars have been in the 200s    Diet Breakfast   Meal replacement shake    Lunch:   Keto   Mixed veggies and chicken   Water Dinner:   Same as lunch   Snacks  No snack  No outpatient medications have been marked as taking for the 09/16/21 encounter (Office Visit) with Pricilla Riffle, MD.     Allergies:   Sumatriptan, Toradol [ketorolac tromethamine], Tramadol, and Naproxen   Past Medical History:  Diagnosis Date   Diabetes mellitus without complication (HCC)    Hypertension    Migraines    Paroxysmal SVT (supraventricular tachycardia) (HCC)     Past Surgical History:  Procedure Laterality Date   HERNIA REPAIR     TUBAL LIGATION       Social History:  The patient  reports that she has never smoked. She has never used smokeless tobacco. She reports current alcohol use. She reports that she does not use drugs.   Family History:  The patient's family history includes Diabetes in her father; Heart attack in her father; Heart failure in her father; Hypertension in her father and mother; Stroke in her mother.    ROS:  Please see  the history of present illness. All other systems are reviewed and  Negative to the above problem except as noted.    PHYSICAL EXAM: VS:  BP (!) 146/90   Pulse 92   Ht 5\' 7"  (1.702 m)   Wt 199 lb 12.8 oz (90.6 kg)   LMP 07/24/2021   SpO2 99%   BMI 31.29 kg/m   GEN: Well nourished, well developed, in no acute distress  HEENT: normal  Neck: no JVD, carotid bruits, or masses Cardiac: RRR; no murmurs, rubs, or gallops,no edema  Respiratory:  clear to auscultation bilaterally, normal work of breathing GI: soft, nontender, nondistended, + BS  No hepatomegaly  MS: no deformity Moving all extremities   Skin: warm and dry, no rash Neuro:  Strength and sensation are intact Psych: euthymic mood, full affect   EKG:  EKG is ordered today.  SR   92 bpm   Prolonged QT   Lipid Panel    Component Value Date/Time   CHOL 236 (H) 09/03/2021 0956   TRIG 134.0 09/03/2021 0956   HDL 42.10  09/03/2021 0956   CHOLHDL 6 09/03/2021 0956   VLDL 26.8 09/03/2021 0956   LDLCALC 167 (H) 09/03/2021 0956      Wt Readings from Last 3 Encounters:  09/16/21 199 lb 12.8 oz (90.6 kg)  09/03/21 198 lb 6.4 oz (90 kg)  08/26/21 194 lb (88 kg)      ASSESSMENT AND PLAN:  1  SVT   Pt with episode of documented SVT   Converted after given verapmi.  It is sugg that she has been  having more spells, shorter lived.    I would recomm  1 stopping amlodipine    2.  Starting diltiazem   240 mg 3  Refer to EP for review / discussion of possible ablation Rx     2  HTN  BP is not controlled   With switch to Cardiazem will need to be follow   Keep on LosartanHCTZ   3  HL   I would recomm starting Crestor given hx of HTN and DM     Goal LDL in 70s     Current medicines are reviewed at length with the patient today.  The patient does not have concerns regarding medicines.  Signed, Dietrich Pates, MD  09/16/2021 3:08 PM    [facility] [address] [place], [place], [place]  [address] Phone: [phone]; Fax: [phone]

## 2021-09-16 NOTE — Patient Instructions (Addendum)
Medication Instructions:  Change Amlodipine to Cardizem 240 mg daily  Start Crestor 20 mg daily  *If you need a refill on your cardiac medications before your next appointment, please call your pharmacy*   Lab Work: Lipid and hepatic in 8 weeks (11/18/21) anytime after 7:30 am fasting If you have labs (blood work) drawn today and your tests are completely normal, you will receive your results only by: MyChart Message (if you have MyChart) OR A paper copy in the mail If you have any lab test that is abnormal or we need to change your treatment, we will call you to review the results.   Testing/Procedures:    Follow-Up: At [facility], you and your health needs are our priority.  As part of our continuing mission to provide you with exceptional heart care, we have created designated Provider Care Teams.  These Care Teams include your primary Cardiologist (physician) and Advanced Practice Providers (APPs -  Physician Assistants and Nurse Practitioners) who all work together to provide you with the care you need, when you need it.  We recommend signing up for the patient portal called "MyChart".  Sign up information is provided on this After Visit Summary.  MyChart is used to connect with patients for Virtual Visits (Telemedicine).  Patients are able to view lab/test results, encounter notes, upcoming appointments, etc.  Non-urgent messages can be sent to your provider as well.   To learn more about what you can do with MyChart, go to [email or web address].    Your next appointment:   6 month(s)  The format for your next appointment:   In Person WITH DR Dietrich Pates    If primary card or EP is not listed click here to update    :1}    Other Instructions  REFERRAL TO EP FOR SVT   Important Information About Sugar

## 2021-09-17 ENCOUNTER — Ambulatory Visit: Payer: Commercial Managed Care - PPO | Admitting: Nurse Practitioner

## 2021-09-17 ENCOUNTER — Encounter: Payer: Self-pay | Admitting: Nurse Practitioner

## 2021-09-17 VITALS — BP 140/98 | HR 80 | Wt 199.0 lb

## 2021-09-17 DIAGNOSIS — E1159 Type 2 diabetes mellitus with other circulatory complications: Secondary | ICD-10-CM

## 2021-09-17 DIAGNOSIS — I10 Essential (primary) hypertension: Secondary | ICD-10-CM

## 2021-09-17 MED ORDER — ONETOUCH ULTRA VI STRP: ORAL_STRIP | 12 refills | Status: DC

## 2021-09-17 MED ORDER — ONETOUCH ULTRASOFT LANCETS MISC: 12 refills | Status: AC

## 2021-09-17 NOTE — Progress Notes (Signed)
   Established Patient Office Visit  Subjective   Patient ID: Paula Robertson, female    DOB: [birth year]  Age: 41 y.o. MRN: [id number]  Chief Complaint  Patient presents with   Follow-up    2 wk f/u HTN, elevated BP    HPI  Paula Robertson is here to follow-up on elevated blood pressure. She has been checking her blood pressure at home and it has been in the 150s/90s. She denies chest pain, shortness of breaths, and headaches. She went to cardiology yesterday who changed her from amlodipine to diltiazem. She was also referred to EP for her SVT.   She needs a refill on her diabetic test strips for her blood sugars. She was also started on rosuvastatin, but she has not picked this up yet from the pharmacy.     ROS See pertinent positives and negatives per HPI.    Objective:     BP (!) 140/98 (BP Location: Left Arm, Cuff Size: Large)   Pulse 80   Wt 199 lb (90.3 kg)   LMP 07/24/2021   SpO2 97%   BMI 31.17 kg/m    Physical Exam Vitals and nursing note reviewed.  Constitutional:      General: She is not in acute distress.    Appearance: Normal appearance.  HENT:     Head: Normocephalic.  Eyes:     Conjunctiva/sclera: Conjunctivae normal.  Cardiovascular:     Rate and Rhythm: Normal rate and regular rhythm.     Pulses: Normal pulses.     Heart sounds: Normal heart sounds.  Pulmonary:     Effort: Pulmonary effort is normal.     Breath sounds: Normal breath sounds.  Musculoskeletal:     Cervical back: Normal range of motion.  Skin:    General: Skin is warm.  Neurological:     General: No focal deficit present.     Mental Status: She is alert and oriented to person, place, and time.  Psychiatric:        Mood and Affect: Mood normal.        Behavior: Behavior normal.        Thought Content: Thought content normal.        Judgment: Judgment normal.     Assessment & Plan:   Problem List Items Addressed This Visit       Cardiovascular and Mediastinum    Hypertension associated with diabetes (HCC) - Primary    Chronic, not controlled. BP today 140/98. Yesterday at cardiology, her amlodipine was changed to diltiazem 240mg  daily. Will see how this change helps her blood pressure and prevents SVT episodes. Keep follow-up appointments with cardiology and follow-up in 2 months.        Return in about 10 weeks (around 11/26/2021) for CPE.    11/28/2021, NP

## 2021-09-17 NOTE — Patient Instructions (Signed)
It was great to see you!  Keep taking your current medications and follow-up with cardiology.   I have ordered more test strips and lancets to check your sugars.   Let me know if you have any trouble getting the medications. Take the metformin right after eating to see if this helps your stomach.   Let's follow-up in 10 weeks, sooner if you have concerns.  If a referral was placed today, you will be contacted for an appointment. Please note that routine referrals can sometimes take up to 3-4 weeks to process. Please call our office if you haven't heard anything after this time frame.  Take care,  Rodman Pickle, NP

## 2021-09-17 NOTE — Assessment & Plan Note (Signed)
Chronic, not controlled. BP today 140/98. Yesterday at cardiology, her amlodipine was changed to diltiazem 240mg  daily. Will see how this change helps her blood pressure and prevents SVT episodes. Keep follow-up appointments with cardiology and follow-up in 2 months.

## 2021-09-29 ENCOUNTER — Other Ambulatory Visit: Payer: Self-pay | Admitting: Nurse Practitioner

## 2021-10-16 ENCOUNTER — Other Ambulatory Visit: Payer: Self-pay | Admitting: Nurse Practitioner

## 2021-10-21 ENCOUNTER — Encounter: Payer: Self-pay | Admitting: Internal Medicine

## 2021-10-21 ENCOUNTER — Ambulatory Visit: Payer: Commercial Managed Care - PPO | Admitting: Cardiology

## 2021-10-21 NOTE — Telephone Encounter (Signed)
Chart supports rx refill  

## 2021-10-30 ENCOUNTER — Institutional Professional Consult (permissible substitution): Payer: Commercial Managed Care - PPO | Admitting: Internal Medicine

## 2021-11-18 ENCOUNTER — Other Ambulatory Visit: Payer: Commercial Managed Care - PPO

## 2021-11-26 ENCOUNTER — Encounter: Payer: Self-pay | Admitting: Nurse Practitioner

## 2021-12-31 ENCOUNTER — Telehealth: Payer: Self-pay

## 2021-12-31 NOTE — Telephone Encounter (Signed)
Transition Care Management Follow-up Telephone Call Date of discharge and from where: [facility]  ED11/21/2023 How have you been since you were released from the hospital? sore Any questions or concerns? No  Items Reviewed: Did the pt receive and understand the discharge instructions provided? Yes  Medications obtained and verified? Yes  Other? No  Any new allergies since your discharge? No  Dietary orders reviewed? Yes Do you have support at home? Yes   Home Care and Equipment/Supplies: Were home health services ordered? no If so, what is the name of the agency? N/a  Has the agency set up a time to come to the patient's home? no Were any new equipment or medical supplies ordered?  No What is the name of the medical supply agency? N/a Were you able to get the supplies/equipment? not applicable Do you have any questions related to the use of the equipment or supplies? No  Functional Questionnaire: (I = Independent and D = Dependent) ADLs: I  Bathing/Dressing- I  Meal Prep- I  Eating- I  Maintaining continence- I  Transferring/Ambulation- I  Managing Meds- I  Follow up appointments reviewed:  PCP Hospital f/u appt confirmed? No  Patient states will call office Monday and schedule when she knows her work schedule [facility] f/u appt confirmed? No   Are transportation arrangements needed? No  If their condition worsens, is the pt aware to call PCP or go to the Emergency Dept.? Yes Was the patient provided with contact information for the PCP's office or ED? Yes Was to pt encouraged to call back with questions or concerns? Yes  Karena Addison, LPN [facility] Nurse Health Advisor Direct Dial [phone]

## 2021-12-31 NOTE — Telephone Encounter (Signed)
error 

## 2022-01-15 ENCOUNTER — Other Ambulatory Visit: Payer: Self-pay | Admitting: Nurse Practitioner

## 2022-02-26 DIAGNOSIS — F411 Generalized anxiety disorder: Secondary | ICD-10-CM | POA: Diagnosis not present

## 2022-03-05 DIAGNOSIS — F411 Generalized anxiety disorder: Secondary | ICD-10-CM | POA: Diagnosis not present

## 2022-03-12 ENCOUNTER — Ambulatory Visit (HOSPITAL_COMMUNITY): Payer: Self-pay

## 2022-06-26 ENCOUNTER — Ambulatory Visit (INDEPENDENT_AMBULATORY_CARE_PROVIDER_SITE_OTHER): Payer: 59 | Admitting: Nurse Practitioner

## 2022-06-26 VITALS — BP 164/100 | HR 68 | Temp 97.0°F | Ht 67.0 in | Wt 203.0 lb

## 2022-06-26 DIAGNOSIS — E1159 Type 2 diabetes mellitus with other circulatory complications: Secondary | ICD-10-CM | POA: Diagnosis not present

## 2022-06-26 DIAGNOSIS — Z7984 Long term (current) use of oral hypoglycemic drugs: Secondary | ICD-10-CM

## 2022-06-26 DIAGNOSIS — E785 Hyperlipidemia, unspecified: Secondary | ICD-10-CM | POA: Diagnosis not present

## 2022-06-26 DIAGNOSIS — E1169 Type 2 diabetes mellitus with other specified complication: Secondary | ICD-10-CM | POA: Diagnosis not present

## 2022-06-26 DIAGNOSIS — I471 Supraventricular tachycardia, unspecified: Secondary | ICD-10-CM | POA: Diagnosis not present

## 2022-06-26 DIAGNOSIS — E1165 Type 2 diabetes mellitus with hyperglycemia: Secondary | ICD-10-CM

## 2022-06-26 DIAGNOSIS — G4700 Insomnia, unspecified: Secondary | ICD-10-CM

## 2022-06-26 DIAGNOSIS — I152 Hypertension secondary to endocrine disorders: Secondary | ICD-10-CM

## 2022-06-26 LAB — COMPREHENSIVE METABOLIC PANEL
ALT: 8 U/L (ref 0–35)
AST: 10 U/L (ref 0–37)
Albumin: 4.2 g/dL (ref 3.5–5.2)
Alkaline Phosphatase: 108 U/L (ref 39–117)
BUN: 9 mg/dL (ref 6–23)
CO2: 29 mEq/L (ref 19–32)
Calcium: 9.4 mg/dL (ref 8.4–10.5)
Chloride: 99 mEq/L (ref 96–112)
Creatinine, Ser: 0.78 mg/dL (ref 0.40–1.20)
GFR: 95.67 mL/min (ref >60.00)
Glucose, Bld: 219 mg/dL — ABNORMAL HIGH (ref 70–99)
Potassium: 4.4 mEq/L (ref 3.5–5.1)
Sodium: 137 mEq/L (ref 135–145)
Total Bilirubin: 0.8 mg/dL (ref 0.2–1.2)
Total Protein: 7.1 g/dL (ref 6.0–8.3)

## 2022-06-26 LAB — LIPID PANEL
Cholesterol: 204 mg/dL — ABNORMAL HIGH (ref 0–200)
HDL: 44.5 mg/dL (ref >39.00)
LDL Cholesterol: 140 mg/dL — ABNORMAL HIGH (ref 0–99)
NonHDL: 159.65
Total CHOL/HDL Ratio: 5
Triglycerides: 97 mg/dL (ref 0.0–149.0)
VLDL: 19.4 mg/dL (ref 0.0–40.0)

## 2022-06-26 LAB — CBC
HCT: 39.1 % (ref 36.0–46.0)
Hemoglobin: 12.9 g/dL (ref 12.0–15.0)
MCHC: 32.9 g/dL (ref 30.0–36.0)
MCV: 87 fl (ref 78.0–100.0)
Platelets: 367 10*3/uL (ref 150.0–400.0)
RBC: 4.49 Mil/uL (ref 3.87–5.11)
RDW: 13.3 % (ref 11.5–15.5)
WBC: 7.6 10*3/uL (ref 4.0–10.5)

## 2022-06-26 LAB — MICROALBUMIN / CREATININE URINE RATIO
Creatinine,U: 132.6 mg/dL
Microalb Creat Ratio: 1.5 mg/g (ref 0.0–30.0)
Microalb, Ur: 2 mg/dL — ABNORMAL HIGH (ref 0.0–1.9)

## 2022-06-26 LAB — HEMOGLOBIN A1C: Hgb A1c MFr Bld: 9.4 % — ABNORMAL HIGH (ref 4.6–6.5)

## 2022-06-26 MED ORDER — LOSARTAN POTASSIUM-HCTZ 50-12.5 MG PO TABS
1.0000 | ORAL_TABLET | Freq: Every day | ORAL | 1 refills | Status: DC
Start: 2022-06-26 — End: 2023-04-28

## 2022-06-26 MED ORDER — TRIAMCINOLONE ACETONIDE 0.1 % EX CREA: 1.0000 | TOPICAL_CREAM | Freq: Two times a day (BID) | CUTANEOUS | 0 refills | Status: AC

## 2022-06-26 MED ORDER — ROSUVASTATIN CALCIUM 20 MG PO TABS: 20.0000 mg | ORAL_TABLET | Freq: Every day | ORAL | 3 refills | Status: DC

## 2022-06-26 MED ORDER — TRAZODONE HCL 50 MG PO TABS: 25.0000 mg | ORAL_TABLET | Freq: Every evening | ORAL | 1 refills | Status: DC | PRN

## 2022-06-26 MED ORDER — METFORMIN HCL ER 500 MG PO TB24: 500.0000 mg | ORAL_TABLET | Freq: Two times a day (BID) | ORAL | 1 refills | Status: DC

## 2022-06-26 MED ORDER — TRULICITY 0.75 MG/0.5ML ~~LOC~~ SOAJ: 0.7500 mg | SUBCUTANEOUS | 2 refills | Status: DC

## 2022-06-26 MED ORDER — DILTIAZEM HCL ER COATED BEADS 240 MG PO CP24: 240.0000 mg | ORAL_CAPSULE | Freq: Every day | ORAL | 3 refills | Status: DC

## 2022-06-26 NOTE — Patient Instructions (Signed)
It was great to see you!  I have refilled your medications.   Start trulicity injection once a week. There is a coupon on their website to get this for $25 a month.   We are checking your labs today and will let you know the results via mychart/phone.   I will fill out your accomodation forms.   I am sending in a cream to start using on your hand twice a day. Use if for 2 weeks at a time, then stop for 2 weeks.   Let's follow-up in 3 months, sooner if you have concerns.  If a referral was placed today, you will be contacted for an appointment. Please note that routine referrals can sometimes take up to 3-4 weeks to process. Please call our office if you haven't heard anything after this time frame.  Take care,  Rodman Pickle, NP

## 2022-06-26 NOTE — Assessment & Plan Note (Signed)
Chronic, stable.  Continue trazodone 25 to 50 mg as needed at bedtime.

## 2022-06-26 NOTE — Assessment & Plan Note (Signed)
Chronic, not controlled.  She has been out of some of her diabetic medications.  Will have her continue metformin XR 500 mg twice a day.  Start Trulicity 0.5 mg injection once a week.  Check A1c, CMP, CBC, urine microalbumin, lipid panel today.  She is up-to-date on her eye exam.  She declines pneumonia vaccine today.  She needs an accommodation form completed for work to be able to have snacks outside of her lunch break during her 12-hour shift.  With her history of diabetes, will write this accommodation for an extra 10-minute break twice a shift to allow for snacks, especially in case of low blood sugars.  Follow-up in 3 months or sooner with concerns.

## 2022-06-26 NOTE — Assessment & Plan Note (Signed)
Chronic, ongoing.  Rosuvastatin 20 mg daily refill sent to the pharmacy.  Check lipid panel today.

## 2022-06-26 NOTE — Assessment & Plan Note (Signed)
Chronic, not controlled.  BP today 164/100.  Continue losartan-hydrochlorothiazide 50-12.5 mg daily.  She has been also out of her diltiazem, will refill this to 40 mg daily.  Encouraged her to continue checking her blood pressure at home, limit salt in her diet.  Check CMP, CBC, lipid panel today.  Follow-up in 3 months or sooner with concerns.

## 2022-06-26 NOTE — Assessment & Plan Note (Signed)
Chronic, stable.  She has not had any episodes of palpitations recently.  She is going to schedule an appointment with cardiology soon.  She has been out of her diltiazem, will refill diltiazem to 40 mg daily.  Check CMP, CBC today.

## 2022-06-26 NOTE — Progress Notes (Signed)
Established Patient Office Visit  Subjective   Patient ID: Paula Robertson, female    DOB: October 11, 1980  Age: 42 y.o. MRN: [id number]  Chief Complaint  Patient presents with   Hypertension    Diabetes follow up, fill out accomodation paperwork, rash on palm of right hand    HPI  Paula Robertson is here to follow-up on diabetes, hypertension, and SVT.   She has also noticed a rash palm of her right hand.  She states that it starts with red bumps and is itching.  Then it starts to peel and will sometimes burn.  Then the rash will go away for a few weeks and then it starts over again.  This has been going on for a few months now.  She has been using Aquaphor which does not seem to help.  She denies any new soaps, laundry detergent, shampoo.  She states that soap at work makes it worse.  She states that she was in between insurance for a while and ran out of her glipizide, diltiazem, and rosuvastatin.  She has been checking her blood pressure at home which she states has been up-and-down.  She states that her sugars have not been controlled and are higher than they were last time.  She has been having some intermittent numbness and tingling in her feet.  She is planning on scheduling an appointment with the cardiologist soon.  She notes that her new job she only gets one break during her 12-hour shift and she needs an accommodation to be able to have a snack and medication on her before after lunch.  ROS See pertinent positives and negatives per HPI.    Objective:     BP (!) 164/100 (BP Location: Left Arm, Cuff Size: Large)   Pulse 68   Temp (!) 97 F (36.1 C)   Ht 5\' 7"  (1.702 m)   Wt 203 lb (92.1 kg)   LMP 06/12/2022 (Approximate)   SpO2 98%   BMI 31.79 kg/m     Physical Exam Vitals and nursing note reviewed.  Constitutional:      General: She is not in acute distress.    Appearance: Normal appearance.  HENT:     Head: Normocephalic.  Eyes:     Conjunctiva/sclera:  Conjunctivae normal.  Cardiovascular:     Rate and Rhythm: Normal rate and regular rhythm.     Pulses: Normal pulses.     Heart sounds: Normal heart sounds.  Pulmonary:     Effort: Pulmonary effort is normal.     Breath sounds: Normal breath sounds.  Musculoskeletal:     Cervical back: Normal range of motion.  Skin:    General: Skin is warm.  Neurological:     General: No focal deficit present.     Mental Status: She is alert and oriented to person, place, and time.  Psychiatric:        Mood and Affect: Mood normal.        Behavior: Behavior normal.        Thought Content: Thought content normal.        Judgment: Judgment normal.      Assessment & Plan:   Problem List Items Addressed This Visit       Cardiovascular and Mediastinum   Hypertension associated with diabetes (HCC)    Chronic, not controlled.  BP today 164/100.  Continue losartan-hydrochlorothiazide 50-12.5 mg daily.  She has been also out of her diltiazem, will refill this to 40 mg daily.  Encouraged her to continue checking her blood pressure at home, limit salt in her diet.  Check CMP, CBC, lipid panel today.  Follow-up in 3 months or sooner with concerns.      Relevant Medications   diltiazem (CARDIZEM CD) 240 MG 24 hr capsule   rosuvastatin (CRESTOR) 20 MG tablet   Dulaglutide (TRULICITY) 0.75 MG/0.5ML SOPN   metFORMIN (GLUCOPHAGE-XR) 500 MG 24 hr tablet   losartan-hydrochlorothiazide (HYZAAR) 50-12.5 MG tablet   Other Relevant Orders   CBC (Completed)   Comprehensive metabolic panel (Completed)   Lipid panel (Completed)   Paroxysmal SVT (supraventricular tachycardia)    Chronic, stable.  She has not had any episodes of palpitations recently.  She is going to schedule an appointment with cardiology soon.  She has been out of her diltiazem, will refill diltiazem to 40 mg daily.  Check CMP, CBC today.      Relevant Medications   diltiazem (CARDIZEM CD) 240 MG 24 hr capsule   rosuvastatin (CRESTOR) 20 MG  tablet   losartan-hydrochlorothiazide (HYZAAR) 50-12.5 MG tablet     Endocrine   Type 2 diabetes mellitus with hyperglycemia, without long-term current use of insulin (HCC) - Primary    Chronic, not controlled.  She has been out of some of her diabetic medications.  Will have her continue metformin XR 500 mg twice a day.  Start Trulicity 0.5 mg injection once a week.  Check A1c, CMP, CBC, urine microalbumin, lipid panel today.  She is up-to-date on her eye exam.  She declines pneumonia vaccine today.  She needs an accommodation form completed for work to be able to have snacks outside of her lunch break during her 12-hour shift.  With her history of diabetes, will write this accommodation for an extra 10-minute break twice a shift to allow for snacks, especially in case of low blood sugars.  Follow-up in 3 months or sooner with concerns.      Relevant Medications   rosuvastatin (CRESTOR) 20 MG tablet   Dulaglutide (TRULICITY) 0.75 MG/0.5ML SOPN   metFORMIN (GLUCOPHAGE-XR) 500 MG 24 hr tablet   losartan-hydrochlorothiazide (HYZAAR) 50-12.5 MG tablet   Other Relevant Orders   CBC (Completed)   Comprehensive metabolic panel (Completed)   Lipid panel (Completed)   Hemoglobin A1c (Completed)   Microalbumin / creatinine urine ratio (Completed)   Hyperlipidemia associated with type 2 diabetes mellitus (HCC)    Chronic, ongoing.  Rosuvastatin 20 mg daily refill sent to the pharmacy.  Check lipid panel today.      Relevant Medications   diltiazem (CARDIZEM CD) 240 MG 24 hr capsule   rosuvastatin (CRESTOR) 20 MG tablet   Dulaglutide (TRULICITY) 0.75 MG/0.5ML SOPN   metFORMIN (GLUCOPHAGE-XR) 500 MG 24 hr tablet   losartan-hydrochlorothiazide (HYZAAR) 50-12.5 MG tablet   Other Relevant Orders   CBC (Completed)   Comprehensive metabolic panel (Completed)   Lipid panel (Completed)     Other   Insomnia    Chronic, stable.  Continue trazodone 25 to 50 mg as needed at bedtime.       Return in  about 3 months (around 09/26/2022) for CPE.    Gerre Scull, NP

## 2022-06-29 ENCOUNTER — Telehealth: Payer: Self-pay

## 2022-06-29 NOTE — Telephone Encounter (Signed)
I called and spoke with patient and notified her that paperwork completed and she will have her husband to pick up. A copy will be place in patient's chart.

## 2022-07-09 ENCOUNTER — Telehealth: Payer: Self-pay | Admitting: Nurse Practitioner

## 2022-07-09 NOTE — Telephone Encounter (Signed)
Pt stated that the pharmacy told her her script for Trulicity needs a PA.

## 2022-07-09 NOTE — Telephone Encounter (Signed)
Can we get a prior auth on Trulicity  0.75mg /0.65ml for patient?  Thank you

## 2022-07-10 ENCOUNTER — Other Ambulatory Visit (HOSPITAL_COMMUNITY): Payer: Self-pay

## 2022-07-10 ENCOUNTER — Telehealth: Payer: Self-pay

## 2022-07-10 NOTE — Telephone Encounter (Signed)
Patient called back and said that it is also $25 at [facility] so she will leave her Rx there.

## 2022-07-10 NOTE — Telephone Encounter (Signed)
I called and notified patient that copay will be $25 if filled at [facility] but I did see that her Rx was [facility] to [facility]. [facility] will call [facility] to check the cost and if it is much higher she would like for RX to be sent to [facility].

## 2022-07-10 NOTE — Telephone Encounter (Signed)
Per CoverMyMeds:  Information regarding your request Your PA has been resolved, no additional PA is required. For further inquiries please contact the number on the back of the member prescription card.  Per [facility] no PA is required.

## 2022-07-10 NOTE — Telephone Encounter (Signed)
Pharmacy Patient Advocate Encounter   Received notification that prior authorization for Trulicity is required/requested.   PA submitted to [facility] via CoverMyMeds Key  # [id number] Status is PA has been resolved, no additional PA is required.   Per test claim, $25 copay if filled at [facility].

## 2022-09-09 ENCOUNTER — Encounter (INDEPENDENT_AMBULATORY_CARE_PROVIDER_SITE_OTHER): Payer: Self-pay

## 2022-09-28 ENCOUNTER — Telehealth: Payer: Self-pay | Admitting: Nurse Practitioner

## 2022-09-28 ENCOUNTER — Encounter: Payer: 59 | Admitting: Nurse Practitioner

## 2022-09-28 NOTE — Telephone Encounter (Signed)
Pt was a no show for a cpe with Lauren on 09/28/22, I did send a letter.

## 2022-09-29 NOTE — Telephone Encounter (Signed)
1st no show, letter sent via mail

## 2022-09-29 NOTE — Telephone Encounter (Signed)
Noted  

## 2022-10-16 ENCOUNTER — Encounter: Payer: Self-pay | Admitting: Pharmacist

## 2022-10-22 ENCOUNTER — Encounter: Payer: Self-pay | Admitting: Nurse Practitioner

## 2022-12-16 ENCOUNTER — Encounter: Payer: Self-pay | Admitting: Nurse Practitioner

## 2022-12-17 MED ORDER — FREESTYLE LIBRE 3 PLUS SENSOR MISC
1 refills | Status: DC
Start: 2022-12-17 — End: 2023-04-28

## 2023-01-06 ENCOUNTER — Encounter: Payer: Self-pay | Admitting: Nurse Practitioner

## 2023-01-22 ENCOUNTER — Other Ambulatory Visit (HOSPITAL_COMMUNITY): Payer: Self-pay

## 2023-01-22 ENCOUNTER — Telehealth: Payer: Self-pay

## 2023-01-22 DIAGNOSIS — E1165 Type 2 diabetes mellitus with hyperglycemia: Secondary | ICD-10-CM

## 2023-01-22 NOTE — Telephone Encounter (Signed)
Pharmacy Patient Advocate Encounter   Received notification from Patient Advice Request messages that prior authorization for Freestyle Libre 3 Plus Senor is required/requested.   Insurance verification completed.   The patient is insured through [employer] .   Per test claim: PA required; PA submitted to above mentioned insurance via CoverMyMeds Key/confirmation #/EOC [id number] Status is pending   Per test claim, the [employer] card has terminated and submitted PA to [employer].

## 2023-01-27 NOTE — Telephone Encounter (Signed)
Pharmacy Patient Advocate Encounter  Received notification from [employer] that Prior Authorization for [facility] 3 Plus sensor has been DENIED.  See denial reason below. No denial letter attached in CMM. Will attach denial letter to Media tab once received.   PA #/Case ID/Reference #: [id number]

## 2023-01-27 NOTE — Telephone Encounter (Signed)
LVM to return call.

## 2023-01-28 ENCOUNTER — Other Ambulatory Visit: Payer: Self-pay | Admitting: Nurse Practitioner

## 2023-01-29 ENCOUNTER — Encounter: Payer: Self-pay | Admitting: Nurse Practitioner

## 2023-01-29 MED ORDER — BLOOD GLUCOSE TEST VI STRP: 1.0000 | ORAL_STRIP | Freq: Every day | 1 refills | Status: AC

## 2023-01-29 MED ORDER — LANCETS MISC. MISC: 1.0000 | Freq: Three times a day (TID) | 1 refills | Status: AC

## 2023-01-29 MED ORDER — BLOOD GLUCOSE MONITORING SUPPL DEVI: 1.0000 | Freq: Every day | 0 refills | Status: AC

## 2023-01-29 NOTE — Telephone Encounter (Signed)
Patient notified of below message and would like for a normal glucometer sent to [facility] on [address]/

## 2023-01-29 NOTE — Addendum Note (Signed)
Addended by: Rodman Pickle A on: 01/29/2023 01:44 PM   Modules accepted: Orders

## 2023-01-29 NOTE — Telephone Encounter (Signed)
 Care team updated and letter sent for eye exam notes.

## 2023-02-24 ENCOUNTER — Telehealth: Payer: Self-pay | Admitting: Physician Assistant

## 2023-02-24 DIAGNOSIS — J019 Acute sinusitis, unspecified: Secondary | ICD-10-CM

## 2023-02-24 DIAGNOSIS — B9789 Other viral agents as the cause of diseases classified elsewhere: Secondary | ICD-10-CM

## 2023-02-24 MED ORDER — IPRATROPIUM BROMIDE 0.03 % NA SOLN: 2.0000 | Freq: Two times a day (BID) | NASAL | 0 refills | Status: AC

## 2023-02-24 NOTE — Progress Notes (Signed)
 I have spent 5 minutes in review of e-visit questionnaire, review and updating patient chart, medical decision making and response to patient.   Piedad Climes, PA-C

## 2023-02-24 NOTE — Progress Notes (Signed)

## 2023-04-22 ENCOUNTER — Emergency Department (HOSPITAL_BASED_OUTPATIENT_CLINIC_OR_DEPARTMENT_OTHER)

## 2023-04-22 ENCOUNTER — Encounter (HOSPITAL_BASED_OUTPATIENT_CLINIC_OR_DEPARTMENT_OTHER): Payer: Self-pay | Admitting: Emergency Medicine

## 2023-04-22 ENCOUNTER — Other Ambulatory Visit: Payer: Self-pay

## 2023-04-22 ENCOUNTER — Emergency Department (HOSPITAL_BASED_OUTPATIENT_CLINIC_OR_DEPARTMENT_OTHER)
Admission: EM | Admit: 2023-04-22 | Discharge: 2023-04-23 | Disposition: A | Discharge status: Home / Self Care | Attending: Emergency Medicine | Admitting: Emergency Medicine

## 2023-04-22 DIAGNOSIS — I10 Essential (primary) hypertension: Secondary | ICD-10-CM | POA: Diagnosis not present

## 2023-04-22 DIAGNOSIS — E876 Hypokalemia: Secondary | ICD-10-CM | POA: Diagnosis not present

## 2023-04-22 DIAGNOSIS — R0789 Other chest pain: Secondary | ICD-10-CM | POA: Insufficient documentation

## 2023-04-22 DIAGNOSIS — O1002 Pre-existing essential hypertension complicating childbirth: Secondary | ICD-10-CM | POA: Diagnosis not present

## 2023-04-22 DIAGNOSIS — R519 Headache, unspecified: Secondary | ICD-10-CM | POA: Diagnosis not present

## 2023-04-22 DIAGNOSIS — R079 Chest pain, unspecified: Secondary | ICD-10-CM | POA: Diagnosis not present

## 2023-04-22 LAB — BASIC METABOLIC PANEL
Anion gap: 12 (ref 5–15)
BUN: 13 mg/dL (ref 6–20)
CO2: 23 mmol/L (ref 22–32)
Calcium: 9.4 mg/dL (ref 8.9–10.3)
Chloride: 100 mmol/L (ref 98–111)
Creatinine, Ser: 0.71 mg/dL (ref 0.44–1.00)
GFR, Estimated: >60 mL/min (ref >60)
Glucose, Bld: 128 mg/dL — ABNORMAL HIGH (ref 70–99)
Potassium: 2.8 mmol/L — ABNORMAL LOW (ref 3.5–5.1)
Sodium: 135 mmol/L (ref 135–145)

## 2023-04-22 LAB — CBC
HCT: 38.3 % (ref 36.0–46.0)
Hemoglobin: 12.8 g/dL (ref 12.0–15.0)
MCH: 28.5 pg (ref 26.0–34.0)
MCHC: 33.4 g/dL (ref 30.0–36.0)
MCV: 85.3 fL (ref 80.0–100.0)
Platelets: 378 10*3/uL (ref 150–400)
RBC: 4.49 MIL/uL (ref 3.87–5.11)
RDW: 12.2 % (ref 11.5–15.5)
WBC: 10.7 10*3/uL — ABNORMAL HIGH (ref 4.0–10.5)
nRBC: 0 % (ref 0.0–0.2)

## 2023-04-22 LAB — PREGNANCY, URINE: Preg Test, Ur: NEGATIVE

## 2023-04-22 LAB — TROPONIN I (HIGH SENSITIVITY): Troponin I (High Sensitivity): 3 ng/L (ref <18)

## 2023-04-22 MED ORDER — POTASSIUM CHLORIDE 10 MEQ/100ML IV SOLN
10.0000 meq | Freq: Once | INTRAVENOUS | Status: AC
  Administered 2023-04-22: 10 meq via INTRAVENOUS
  Filled 2023-04-22: qty 100

## 2023-04-22 MED ORDER — PROCHLORPERAZINE EDISYLATE 10 MG/2ML IJ SOLN
10.0000 mg | Freq: Once | INTRAMUSCULAR | Status: AC
  Administered 2023-04-22: 10 mg via INTRAVENOUS
  Filled 2023-04-22: qty 2

## 2023-04-22 MED ORDER — DIPHENHYDRAMINE HCL 50 MG/ML IJ SOLN
25.0000 mg | Freq: Once | INTRAMUSCULAR | Status: AC
  Administered 2023-04-22: 25 mg via INTRAVENOUS
  Filled 2023-04-22: qty 1

## 2023-04-22 MED ORDER — DEXAMETHASONE SODIUM PHOSPHATE 10 MG/ML IJ SOLN
10.0000 mg | Freq: Once | INTRAMUSCULAR | Status: AC
  Administered 2023-04-22: 10 mg via INTRAVENOUS
  Filled 2023-04-22: qty 1

## 2023-04-22 MED ORDER — POTASSIUM CHLORIDE CRYS ER 20 MEQ PO TBCR
40.0000 meq | EXTENDED_RELEASE_TABLET | Freq: Once | ORAL | Status: AC
  Administered 2023-04-22: 40 meq via ORAL
  Filled 2023-04-22: qty 2

## 2023-04-22 NOTE — ED Triage Notes (Signed)
 Pt POV c/o HTN, chest pain intermittent x3 days and a migraine which she had this morning, resolved then returned appx 1.5 hr PTA.  Denies fever, Shob.  C/o nausea, denies emesis. Reports compliance with HTN meds.   Hx of migraines. Took advil earlier today, headache returned.  Also c/o intermittent tingling of bottom of feet x1 week.

## 2023-04-22 NOTE — ED Provider Notes (Signed)
 [place] [facility] Provider Note   CSN: [id number] Arrival date & time: 04/22/23  1801     History  Chief Complaint  Patient presents with   Chest Pain   Headache    Paula Robertson is a 43 y.o. female with past medical history of non-insulin-dependent T2DM, HTN, paroxysmal SVT, HLD presents to emergency department for evaluation of multiple complaints.    Most notably, patient complains of headache with associated photophobia that she has had intermittently.  She believes that this is related to her blood pressure. She also has a history of migraines but reports this is worse than normal migraines.  She has been taking ibuprofen and Tylenol that improves headache for a couple hours and then headache returns.  She is not on thinners and denies recent head injury.  She also complains of HTN ranging from 150-200 SBP since being seen by PCP in 06/26/22 when she was prescribed Hyzaar and diltiazem, However, due to insurance fluctuations, she has not been able to routinely fill her prescriptions so has not been taking BP medications consistently.  She reports that she has been on these medications consistently for past two weeks.  She also complains of intermittent chest pain for the last couple days.  She describes this pain as a substernal squeezing pain.  No associated shortness of breath.  She reports that chest pain worsens when sitting and is not exertional in nature.  She is not currently having chest pain in ED.  She denies infectious symptoms, cough, pedal edema, history of clots, hemoptysis   Chest Pain Associated symptoms: headache   Associated symptoms: no abdominal pain, no cough, no dizziness, no fatigue, no fever, no nausea, no numbness, no palpitations, no shortness of breath, no vomiting and no weakness   Headache Associated symptoms: no abdominal pain, no cough, no diarrhea, no dizziness, no fatigue, no fever, no nausea, no numbness, no  seizures, no vomiting and no weakness       Home Medications Prior to Admission medications   Medication Sig Start Date End Date Taking? Authorizing Provider  blood glucose meter kit and supplies KIT Dispense based on patient and insurance preference. Use up to four times daily as directed. 09/03/21   McElwee, Kaidan Harpster A, NP  Blood Glucose Monitoring Suppl DEVI 1 each by Does not apply route daily. May substitute to any manufacturer covered by patient's insurance. 01/29/23   McElwee, Eddison Searls A, NP  Continuous Glucose Sensor (FREESTYLE LIBRE 3 PLUS SENSOR) MISC Change sensor every 15 days. 12/17/22   McElwee, Jake Church, NP  diltiazem (CARDIZEM CD) 240 MG 24 hr capsule Take 1 capsule (240 mg total) by mouth daily. 06/26/22   McElwee, Eliceo Gladu A, NP  Dulaglutide (TRULICITY) 0.75 MG/0.5ML SOPN Inject 0.75 mg into the skin once a week. 06/26/22   McElwee, Taevion Sikora A, NP  Glucose Blood (BLOOD GLUCOSE TEST STRIPS) STRP 1 each by In Vitro route daily. May substitute to any manufacturer covered by patient's insurance. 01/29/23 08/17/23  McElwee, Haroldine Redler A, NP  ipratropium (ATROVENT) 0.03 % nasal spray Place 2 sprays into both nostrils every 12 (twelve) hours. 02/24/23   Waldon Merl, PA-C  Lancets [facility] ULTRASOFT) lancets Use as instructed 09/17/21   McElwee, Jake Church, NP  losartan-hydrochlorothiazide (HYZAAR) 50-12.5 MG tablet Take 1 tablet by mouth daily. 06/26/22   McElwee, Jake Church, NP  Magnesium Oxide -Mg Supplement 200 MG TABS Take 1 tablet (200 mg total) by mouth 2 (two) times daily. 09/03/21  McElwee, Halton Neas A, NP  metFORMIN (GLUCOPHAGE-XR) 500 MG 24 hr tablet Take 1 tablet (500 mg total) by mouth in the morning and at bedtime. 06/26/22   McElwee, Kewan Mcnease A, NP  rosuvastatin (CRESTOR) 20 MG tablet Take 1 tablet (20 mg total) by mouth daily. 06/26/22   McElwee, Jake Church, NP  traZODone (DESYREL) 50 MG tablet Take 0.5-1 tablets (25-50 mg total) by mouth at bedtime as needed for sleep. 06/26/22   McElwee, Nemesis Rainwater A,  NP  triamcinolone cream (KENALOG) 0.1 % Apply 1 Application topically 2 (two) times daily. 06/26/22   McElwee, Jake Church, NP      Allergies    Sumatriptan, Toradol [ketorolac tromethamine], Tramadol, and Naproxen    Review of Systems   Review of Systems  Constitutional:  Negative for chills, fatigue and fever.  Respiratory:  Negative for cough, chest tightness, shortness of breath and wheezing.   Cardiovascular:  Positive for chest pain. Negative for palpitations.  Gastrointestinal:  Negative for abdominal pain, constipation, diarrhea, nausea and vomiting.  Neurological:  Positive for headaches. Negative for dizziness, seizures, weakness, light-headedness and numbness.    Physical Exam Updated Vital Signs BP (!) 151/80 (BP Location: Right Arm)   Pulse 71   Temp 98.2 F (36.8 C) (Oral)   Resp 16   Ht 5\' 7"  (1.702 m)   Wt 84.4 kg   LMP 03/29/2023 (Approximate)   SpO2 97%   BMI 29.13 kg/m  Physical Exam Vitals and nursing note reviewed.  Constitutional:      General: She is not in acute distress.    Appearance: Normal appearance. She is not ill-appearing.  HENT:     Head: Normocephalic and atraumatic.     Right Ear: Tympanic membrane, ear canal and external ear normal.     Left Ear: Tympanic membrane, ear canal and external ear normal.     Mouth/Throat:     Mouth: Mucous membranes are moist.     Pharynx: No oropharyngeal exudate or posterior oropharyngeal erythema.     Comments: Uvula midline. No abscess, fluctuance, erythema noted in mouth Eyes:     General: No scleral icterus.       Right eye: No discharge.        Left eye: No discharge.     Extraocular Movements:     Right eye: Normal extraocular motion and no nystagmus.     Left eye: Normal extraocular motion and no nystagmus.     Conjunctiva/sclera: Conjunctivae normal.  Neck:     Comments: No meningismus Cardiovascular:     Rate and Rhythm: Normal rate.     Pulses: Normal pulses.  Pulmonary:     Effort:  Pulmonary effort is normal. No respiratory distress.     Breath sounds: Normal breath sounds. No stridor. No wheezing or rhonchi.     Comments: He can full complete sentences without difficulty.  Oxygen saturations without supplementation Chest:     Chest wall: No tenderness.  Abdominal:     General: There is no distension.     Palpations: Abdomen is soft. There is no mass.     Tenderness: There is no abdominal tenderness. There is no guarding.  Musculoskeletal:     Cervical back: Normal range of motion and neck supple. No rigidity or tenderness.     Right lower leg: No edema.     Left lower leg: No edema.     Comments: No calf tenderness bilaterally.  Negative Homans' sign  Lymphadenopathy:     Cervical:  No cervical adenopathy.  Skin:    General: Skin is warm.     Capillary Refill: Capillary refill takes less than 2 seconds.     Coloration: Skin is not jaundiced or pale.  Neurological:     General: No focal deficit present.     Mental Status: She is alert and oriented to person, place, and time. Mental status is at baseline.     Cranial Nerves: No cranial nerve deficit.     Sensory: No sensory deficit.     Motor: No weakness.     Coordination: Coordination normal.     Gait: Gait normal.     Deep Tendon Reflexes: Reflexes normal.     Comments: Follows commands without difficulty.  Ambulates without difficulty, weakness, nor limp     ED Results / Procedures / Treatments   Labs (all labs ordered are listed, but only abnormal results are displayed) Labs Reviewed  BASIC METABOLIC PANEL - Abnormal; Notable for the following components:      Result Value   Potassium 2.8 (*)    Glucose, Bld 128 (*)    All other components within normal limits  CBC - Abnormal; Notable for the following components:   WBC 10.7 (*)    All other components within normal limits  PREGNANCY, URINE  TROPONIN I (HIGH SENSITIVITY)  TROPONIN I (HIGH SENSITIVITY)    EKG EKG  Interpretation Date/Time:  Thursday April 22 2023 18:08:12 EDT Ventricular Rate:  84 PR Interval:  153 QRS Duration:  92 QT Interval:  383 QTC Calculation: 453 R Axis:   14  Text Interpretation: Sinus rhythm Right atrial enlargement Confirmed by Vanetta Mulders [phone]) on 04/22/2023 10:59:28 PM  Radiology DG Chest 2 View Result Date: 04/22/2023 CLINICAL DATA:  Chest pain EXAM: CHEST - 2 VIEW COMPARISON:  None Available. FINDINGS: The heart size and mediastinal contours are within normal limits. Both lungs are clear. The visualized skeletal structures are unremarkable. IMPRESSION: No active cardiopulmonary disease. Electronically Signed   By: Helyn Numbers M.D.   On: 04/22/2023 21:46    Procedures Procedures    Medications Ordered in ED Medications  potassium chloride 10 mEq in 100 mL IVPB (10 mEq Intravenous New Bag/Given 04/22/23 2313)  prochlorperazine (COMPAZINE) injection 10 mg (10 mg Intravenous Given 04/22/23 2253)  diphenhydrAMINE (BENADRYL) injection 25 mg (25 mg Intravenous Given 04/22/23 2253)  dexamethasone (DECADRON) injection 10 mg (10 mg Intravenous Given 04/22/23 2250)  potassium chloride SA (KLOR-CON M) CR tablet 40 mEq (40 mEq Oral Given 04/22/23 2239)    ED Course/ Medical Decision Making/ A&P                                 Medical Decision Making Amount and/or Complexity of Data Reviewed Labs: ordered. Radiology: ordered.  Risk Prescription drug management.   Patient presents to the ED for concern of chest pain, HTN, headache, this involves an extensive number of treatment options, and is a complaint that carries with it a high risk of complications and morbidity.  The differential diagnosis includes ACS, pneumonia, PE, hypertensive crisis, ICH, migraine, electrolyte abnormality   Co morbidities that complicate the patient evaluation  See HPI  Additional history obtained:  Additional history obtained from Nursing and Outside Medical Records    External records from outside source obtained and reviewed including triage RN note and PCP note   Lab Tests:  I Ordered, and personally interpreted labs.  The  pertinent results include:   Hypokalemia of 2.8 Creatinine WNL   Imaging Studies ordered:  I ordered imaging studies including x-ray I independently visualized and interpreted imaging which showed no acute cardiopulmonary pathology I agree with the radiologist interpretation   Cardiac Monitoring:  The patient was maintained on a cardiac monitor.  I personally viewed and interpreted the cardiac monitored which showed an underlying rhythm of: NSR with no flattened T waves, STE, nor ischemia noted   Medicines ordered and prescription drug management:  I ordered medication including potassium, Compazine, Benadryl, Decadron for hypo-kalemia migraine cocktail Reevaluation of the patient after these medicines showed that the patient improved I have reviewed the patients home medicines and have made adjustments as needed    Problem List / ED Course:  Chest pain Reports improvement of chest pain following arrival to ED Troponin x 1 negative.  EKG without signs of STE nor ischemia Chest x-ray negative for acute cardiopulmonary pathology Chest pain is not exertional in nature nor has shortness of breath associated Low suspicion for PE as cause.  PERC negative Bad headache Shared decision making is had with patient regarding obtaining CT imaging of head as she reports this headache is worse than other migraine she has had in the past.  However, she does not wish to pursue imaging at this time and is interested in migraine cocktail.  I think this is reasonable as she has not had any head injury and is neurologically intact with no visual disturbances with a history of migraines Hypokalemia EKG is without flattened T waves Provided IV and p.o. supplementation Discussed high potassium diet over the next couple days and  following up with primary care provider regarding potassium This may be due to Hyzaar as she is not having any vomiting or diarrhea at this time Hypertension Patient is not consistently taking medications due to insurance issues  Currently, no hypertensive crisis or emergency noted.  Creatinine WNL. I do not feel that I should adjust blood pressure medications here in the ED as she has not been fully compliant with meds.  Her BP was likely worsened with migraine as it decreased following migraine cocktail and had improvement of symptoms She typically is in the 160-180 SBP range when looked on chart review   Reevaluation:  After the interventions noted above, I reevaluated the patient and found that they have :improved   Social Determinants of Health:  Has PCP follow-up   Dispostion:  After consideration of the diagnostic results and the patients response to treatment, I feel that the patent would benefit from outpatient management, PCP follow-up, medication compliance.   Discussed ED workup, disposition, return to ED precautions with patient who expresses understanding agrees with plan.  All questions answered to their satisfaction.  They are agreeable to plan.  Discharge instructions provided on paperwork  Final Clinical Impression(s) / ED Diagnoses Final diagnoses:  Atypical chest pain  Bad headache  Hypokalemia  Hypertension, unspecified type    Rx / DC Orders ED Discharge Orders     None         Judithann Sheen, PA 04/23/23 0001    Vanetta Mulders, MD 04/24/23 [phone]

## 2023-04-23 NOTE — Discharge Instructions (Addendum)
 Thank you for letting us evaluate you today.  I provided you with a migraine cocktail here in the emergency department improving her headache.  I have also provided you with potassium to supplement your low potassium noted here in the emergency department.  Your heart enzyme was negative for injury so I do not feel like you are having an acute coronary syndrome.  Your chest x-ray was negative for pneumonia, fluid  Please follow-up with PCP regarding blood pressure management

## 2023-04-26 ENCOUNTER — Telehealth: Payer: Self-pay

## 2023-04-26 NOTE — Transitions of Care (Post Inpatient/ED Visit) (Signed)
   04/26/2023  Name: Paula Robertson MRN: [id number] DOB: 10-Nov-1980  Today's TOC FU Call Status: Today's TOC FU Call Status:: Unsuccessful Call (1st Attempt) Unsuccessful Call (1st Attempt) Date: 04/23/23  Attempted to reach the patient regarding the most recent Inpatient/ED visit.  Follow Up Plan: Additional outreach attempts will be made to reach the patient to complete the Transitions of Care (Post Inpatient/ED visit) call.   Signature  Blain Hunsucker D, CMA

## 2023-04-27 NOTE — Transitions of Care (Post Inpatient/ED Visit) (Signed)
   04/27/2023  Name: Paula Robertson MRN: [id number] DOB: 04-10-1980  Today's TOC FU Call Status: Today's TOC FU Call Status:: Unsuccessful Call (2nd Attempt) Unsuccessful Call (1st Attempt) Date: 04/26/23 Unsuccessful Call (2nd Attempt) Date: 04/27/23  Attempted to reach the patient regarding the most recent Inpatient/ED visit.  Follow Up Plan: Additional outreach attempts will be made to reach the patient to complete the Transitions of Care (Post Inpatient/ED visit) call.   Signature  Jenny Reichmann

## 2023-04-28 ENCOUNTER — Telehealth: Payer: Self-pay

## 2023-04-28 ENCOUNTER — Encounter: Payer: Self-pay | Admitting: Nurse Practitioner

## 2023-04-28 ENCOUNTER — Ambulatory Visit: Payer: Self-pay | Admitting: Nurse Practitioner

## 2023-04-28 ENCOUNTER — Other Ambulatory Visit (HOSPITAL_COMMUNITY): Payer: Self-pay

## 2023-04-28 VITALS — BP 174/104 | HR 80 | Temp 97.3°F | Ht 67.0 in | Wt 186.2 lb

## 2023-04-28 DIAGNOSIS — I471 Supraventricular tachycardia, unspecified: Secondary | ICD-10-CM

## 2023-04-28 DIAGNOSIS — E876 Hypokalemia: Secondary | ICD-10-CM | POA: Diagnosis not present

## 2023-04-28 DIAGNOSIS — E1159 Type 2 diabetes mellitus with other circulatory complications: Secondary | ICD-10-CM | POA: Diagnosis not present

## 2023-04-28 DIAGNOSIS — E1169 Type 2 diabetes mellitus with other specified complication: Secondary | ICD-10-CM

## 2023-04-28 DIAGNOSIS — Z7985 Long-term (current) use of injectable non-insulin antidiabetic drugs: Secondary | ICD-10-CM | POA: Diagnosis not present

## 2023-04-28 DIAGNOSIS — E785 Hyperlipidemia, unspecified: Secondary | ICD-10-CM | POA: Diagnosis not present

## 2023-04-28 DIAGNOSIS — E1165 Type 2 diabetes mellitus with hyperglycemia: Secondary | ICD-10-CM | POA: Diagnosis not present

## 2023-04-28 DIAGNOSIS — I152 Hypertension secondary to endocrine disorders: Secondary | ICD-10-CM

## 2023-04-28 DIAGNOSIS — G4700 Insomnia, unspecified: Secondary | ICD-10-CM

## 2023-04-28 DIAGNOSIS — G43709 Chronic migraine without aura, not intractable, without status migrainosus: Secondary | ICD-10-CM

## 2023-04-28 LAB — BASIC METABOLIC PANEL
BUN: 9 mg/dL (ref 6–23)
CO2: 28 meq/L (ref 19–32)
Calcium: 9.3 mg/dL (ref 8.4–10.5)
Chloride: 100 meq/L (ref 96–112)
Creatinine, Ser: 0.77 mg/dL (ref 0.40–1.20)
GFR: 96.59 mL/min (ref >60.00)
Glucose, Bld: 181 mg/dL — ABNORMAL HIGH (ref 70–99)
Potassium: 3.7 meq/L (ref 3.5–5.1)
Sodium: 136 meq/L (ref 135–145)

## 2023-04-28 LAB — CBC WITH DIFFERENTIAL/PLATELET
Basophils Absolute: 0 10*3/uL (ref 0.0–0.1)
Basophils Relative: 0.4 % (ref 0.0–3.0)
Eosinophils Absolute: 0.1 10*3/uL (ref 0.0–0.7)
Eosinophils Relative: 1.8 % (ref 0.0–5.0)
HCT: 37.8 % (ref 36.0–46.0)
Hemoglobin: 12.5 g/dL (ref 12.0–15.0)
Lymphocytes Relative: 41.3 % (ref 12.0–46.0)
Lymphs Abs: 2.3 10*3/uL (ref 0.7–4.0)
MCHC: 32.9 g/dL (ref 30.0–36.0)
MCV: 86.4 fl (ref 78.0–100.0)
Monocytes Absolute: 0.4 10*3/uL (ref 0.1–1.0)
Monocytes Relative: 7.6 % (ref 3.0–12.0)
Neutro Abs: 2.8 10*3/uL (ref 1.4–7.7)
Neutrophils Relative %: 48.9 % (ref 43.0–77.0)
Platelets: 333 10*3/uL (ref 150.0–400.0)
RBC: 4.38 Mil/uL (ref 3.87–5.11)
RDW: 12.9 % (ref 11.5–15.5)
WBC: 5.6 10*3/uL (ref 4.0–10.5)

## 2023-04-28 LAB — LIPID PANEL
Cholesterol: 185 mg/dL (ref 0–200)
HDL: 46.7 mg/dL (ref >39.00)
LDL Cholesterol: 116 mg/dL — ABNORMAL HIGH (ref 0–99)
NonHDL: 138.74
Total CHOL/HDL Ratio: 4
Triglycerides: 112 mg/dL (ref 0.0–149.0)
VLDL: 22.4 mg/dL (ref 0.0–40.0)

## 2023-04-28 LAB — MAGNESIUM: Magnesium: 1.7 mg/dL (ref 1.5–2.5)

## 2023-04-28 LAB — HEMOGLOBIN A1C: Hgb A1c MFr Bld: 8 % — ABNORMAL HIGH (ref 4.6–6.5)

## 2023-04-28 MED ORDER — FREESTYLE LIBRE 3 PLUS SENSOR MISC: 1 refills | Status: DC

## 2023-04-28 MED ORDER — TRULICITY 0.75 MG/0.5ML ~~LOC~~ SOAJ: 0.7500 mg | SUBCUTANEOUS | 2 refills | Status: DC

## 2023-04-28 MED ORDER — METOPROLOL SUCCINATE ER 50 MG PO TB24: 50.0000 mg | ORAL_TABLET | Freq: Every day | ORAL | 1 refills | Status: DC

## 2023-04-28 MED ORDER — TRAZODONE HCL 50 MG PO TABS
50.0000 mg | ORAL_TABLET | Freq: Every evening | ORAL | 1 refills | Status: AC | PRN
Start: 2023-04-28 — End: ?

## 2023-04-28 MED ORDER — ROSUVASTATIN CALCIUM 20 MG PO TABS: 20.0000 mg | ORAL_TABLET | Freq: Every day | ORAL | 3 refills | Status: DC

## 2023-04-28 MED ORDER — MAGNESIUM OXIDE -MG SUPPLEMENT 200 MG PO TABS: 200.0000 mg | ORAL_TABLET | Freq: Two times a day (BID) | ORAL | 0 refills | Status: AC

## 2023-04-28 MED ORDER — LOSARTAN POTASSIUM-HCTZ 50-12.5 MG PO TABS: 1.0000 | ORAL_TABLET | Freq: Every day | ORAL | 1 refills | Status: AC

## 2023-04-28 MED ORDER — NURTEC 75 MG PO TBDP: 75.0000 mg | ORAL_TABLET | ORAL | 1 refills | Status: DC | PRN

## 2023-04-28 NOTE — Telephone Encounter (Signed)
 Pharmacy Patient Advocate Encounter   Received notification from Pt Calls Messages that prior authorization for Nurtec 75mg  is required/requested.   Insurance verification completed.   The patient is insured through [employer] .   Per test claim: PA required; PA submitted to above mentioned insurance via CoverMyMeds Key/confirmation #/EOC [id number] Status is pending

## 2023-04-28 NOTE — Patient Instructions (Signed)
 It was great to see you!  I have refilled all of your medications  Start metoprolol once a day for your blood pressure in addition to losartan  Start trulicity once a week.   Start nurtec every other day as needed for migraine - this may need a prior auth too   We are checking your labs today and will let you know the results via mychart/phone.   Let's follow-up in 2 weeks, sooner if you have concerns.  If a referral was placed today, you will be contacted for an appointment. Please note that routine referrals can sometimes take up to 3-4 weeks to process. Please call our office if you haven't heard anything after this time frame.  Take care,  Rodman Pickle, NP

## 2023-04-28 NOTE — Telephone Encounter (Signed)
 Pharmacy Patient Advocate Encounter   Received notification from Pt Calls Messages that prior authorization for Trulicity is required/requested.   Insurance verification completed.   The patient is insured through [employer] .   Per test claim: PA required; PA submitted to above mentioned insurance via CoverMyMeds Key/confirmation #/EOC [id number] Status is pending

## 2023-04-28 NOTE — Telephone Encounter (Signed)
 Patient said that she received a notification that a Prior Berkley Harvey is needed on Nurtec and Trulicity.

## 2023-04-28 NOTE — Assessment & Plan Note (Signed)
 Chronic migraines likely worsened by hypertension. She has tried topamax and sumatriptan in the past, however she had side effects. She is currently taking a beta blocker, and had side effects to sumatriptan. Blood pressure is not controlled for another triptan. Will start nurtec 75mg  every other day as needed for migraine.

## 2023-04-28 NOTE — Assessment & Plan Note (Signed)
 Chronic, ongoing. Refill trazodone 50-100 mg at bedtime.

## 2023-04-28 NOTE — Telephone Encounter (Signed)
 Pharmacy Patient Advocate Encounter   Received notification from CoverMyMeds that prior authorization for [facility] is required/requested.   Insurance verification completed.   The patient is insured through [employer] .   Per test claim: PA required; PA submitted to above mentioned insurance via CoverMyMeds Key/confirmation #/EOC [facility] Status is pending

## 2023-04-28 NOTE — Progress Notes (Signed)
 Established Patient Office Visit  Subjective   Patient ID: Paula Robertson, female    DOB: January 21, 1981  Age: 43 y.o. MRN: [id number]  Chief Complaint  Patient presents with   Medication Management    Discuss changing medication, Rx refills, Request a Rx of Glucose Monitor    HPI  Discussed the use of AI scribe software for clinical note transcription with the patient, who gave verbal consent to proceed.  History of Present Illness   The patient, with a history of hypertension, diabetes, and anxiety, presents with multiple concerns. She reports that her blood pressure has been "out of whack," causing her blood sugar levels to rise. Over the past couple of weeks, her blood pressure readings have been over 200, leading to a hospital visit on 04/22/23. She also reports intermittent chest pain and worsening migraines. She states that they gave her potassium at her ER visit due to low potassium and they did not refill any of her blood pressure medications, other than to stop the diltiazem. She has not been taking her medications due to loss of insurance. She has recently got insurance again.    She has been experiencing headaches every other day for the past several months. She has tried topomax in the past, however had side effects, and also had side effects to sumatriptan causing dizziness and chest pain.   Regarding her diabetes management, the patient has been trying to take Metformin, but it has been causing her to have diarrhea all day. She expresses a desire to switch to a different medication.  The patient also reports having anxiety and trouble sleeping. She has run out of her Trazodone prescription, which she previously found helpful.       ROS See pertinent positives and negatives per HPI.    Objective:     BP (!) 174/104 (BP Location: Left Arm, Cuff Size: Large)   Pulse 80   Temp (!) 97.3 F (36.3 C)   Ht 5\' 7"  (1.702 m)   Wt 186 lb 3.2 oz (84.5 kg)   LMP 03/29/2023  (Approximate)   SpO2 98%   BMI 29.16 kg/m  BP Readings from Last 3 Encounters:  04/28/23 (!) 174/104  04/23/23 (!) 144/78  06/26/22 (!) 164/100   Wt Readings from Last 3 Encounters:  04/28/23 186 lb 3.2 oz (84.5 kg)  04/22/23 186 lb (84.4 kg)  06/26/22 203 lb (92.1 kg)      Physical Exam Vitals and nursing note reviewed.  Constitutional:      General: She is not in acute distress.    Appearance: Normal appearance.  HENT:     Head: Normocephalic.  Eyes:     Conjunctiva/sclera: Conjunctivae normal.  Cardiovascular:     Rate and Rhythm: Normal rate and regular rhythm.     Pulses: Normal pulses.     Heart sounds: Normal heart sounds.  Pulmonary:     Effort: Pulmonary effort is normal.     Breath sounds: Normal breath sounds.  Musculoskeletal:     Cervical back: Normal range of motion.  Skin:    General: Skin is warm.  Neurological:     General: No focal deficit present.     Mental Status: She is alert and oriented to person, place, and time.  Psychiatric:        Mood and Affect: Mood normal.        Behavior: Behavior normal.        Thought Content: Thought content normal.  Judgment: Judgment normal.    The 10-year ASCVD risk score (Arnett DK, et al., 2019) is: 28.4%    Assessment & Plan:   Problem List Items Addressed This Visit       Cardiovascular and Mediastinum   Hypertension associated with diabetes (HCC) - Primary   Chronic, not controlled. Hypertension with recent severe elevations. Diltiazem discontinued due to hypokalemia. Continue losartan-hctz 50-12.5mg  and start Metoprolol XL 50mg  daily added for heart rate control. Check potassium and magnesium levels. Recheck blood pressure in two weeks.      Relevant Medications   Dulaglutide (TRULICITY) 0.75 MG/0.5ML SOAJ   losartan-hydrochlorothiazide (HYZAAR) 50-12.5 MG tablet   rosuvastatin (CRESTOR) 20 MG tablet   metoprolol succinate (TOPROL XL) 50 MG 24 hr tablet   Other Relevant Orders   Basic  metabolic panel   CBC with Differential/Platelet   Paroxysmal SVT (supraventricular tachycardia) (HCC)   Switching diltiazem to Toprol XL 50mg  daily. Follow-up in 2 weeks.       Relevant Medications   losartan-hydrochlorothiazide (HYZAAR) 50-12.5 MG tablet   rosuvastatin (CRESTOR) 20 MG tablet   metoprolol succinate (TOPROL XL) 50 MG 24 hr tablet   Chronic migraine without aura without status migrainosus, not intractable   Chronic migraines likely worsened by hypertension. She has tried topamax and sumatriptan in the past, however she had side effects. She is currently taking a beta blocker, and had side effects to sumatriptan. Blood pressure is not controlled for another triptan. Will start nurtec 75mg  every other day as needed for migraine.       Relevant Medications   losartan-hydrochlorothiazide (HYZAAR) 50-12.5 MG tablet   rosuvastatin (CRESTOR) 20 MG tablet   traZODone (DESYREL) 50 MG tablet   metoprolol succinate (TOPROL XL) 50 MG 24 hr tablet   Rimegepant Sulfate (NURTEC) 75 MG TBDP     Endocrine   Type 2 diabetes mellitus with hyperglycemia, without long-term current use of insulin (HCC)   Chronic, not controlled. Type 2 Diabetes with recent high glucose levels. Metformin stopped due to side effects. Refill Trulicity 0.75mg  injection weekly. Check insurance for continuous glucose monitor. Check CMP, CBC, A1c today and potentially add SGLT2-I based on results.      Relevant Medications   Dulaglutide (TRULICITY) 0.75 MG/0.5ML SOAJ   losartan-hydrochlorothiazide (HYZAAR) 50-12.5 MG tablet   rosuvastatin (CRESTOR) 20 MG tablet   Other Relevant Orders   Basic metabolic panel   CBC with Differential/Platelet   Hemoglobin A1c   Hyperlipidemia associated with type 2 diabetes mellitus (HCC)   Hyperlipidemia untreated due to insurance issues. Refill rosuvastatin 20mg  daily.       Relevant Medications   Dulaglutide (TRULICITY) 0.75 MG/0.5ML SOAJ   losartan-hydrochlorothiazide  (HYZAAR) 50-12.5 MG tablet   rosuvastatin (CRESTOR) 20 MG tablet   metoprolol succinate (TOPROL XL) 50 MG 24 hr tablet   Other Relevant Orders   Basic metabolic panel   CBC with Differential/Platelet   Lipid panel     Other   Insomnia   Chronic, ongoing. Refill trazodone 50-100 mg at bedtime.       Hypokalemia   Check K and Mg levels today.       Relevant Orders   Basic metabolic panel   Magnesium    Return in about 2 weeks (around 05/12/2023) for HTN.    Gerre Scull, NP

## 2023-04-28 NOTE — Addendum Note (Signed)
 Addended by: Rodman Pickle A on: 04/28/2023 03:21 PM   Modules accepted: Orders

## 2023-04-28 NOTE — Telephone Encounter (Signed)
 Pharmacy Patient Advocate Encounter  Received notification from [employer] that Prior Authorization for Trulicity 0.75mg /0.54ml has been APPROVED from 03/29/23 to 04/27/24   PA #/Case ID/Reference #: [id number]

## 2023-04-28 NOTE — Telephone Encounter (Signed)
 Pharmacy Patient Advocate Encounter  Received notification from [employer] that Prior Authorization for Nurtec 75mg  has been APPROVED from 03/29/23 to 04/27/24   PA #/Case ID/Reference #: [id number]

## 2023-04-28 NOTE — Transitions of Care (Post Inpatient/ED Visit) (Signed)
 04/28/2023  Name: Paula Robertson MRN: [id number] DOB: 01-16-1981  Today's TOC FU Call Status: Today's TOC FU Call Status:: Successful TOC FU Call Completed Unsuccessful Call (1st Attempt) Date: 04/28/23 Unsuccessful Call (2nd Attempt) Date: 04/27/23 [facility] FU Call Complete Date: 04/28/23 Patient's Name and Date of Birth confirmed.  Transition Care Management Follow-up Telephone Call Date of Discharge: 04/22/23 Discharge Facility: [facility] Type of Discharge: Emergency Department Reason for ED Visit: Other: (Elevated Blood Pressure) Cardiac Conditions Diagnosis:  (Elevated Blood Pressure) How have you been since you were released from the hospital?: Better Any questions or concerns?: Yes Patient Questions/Concerns:: Concerns with blood pressure and potassium Patient Questions/Concerns Addressed: Notified Provider of Patient Questions/Concerns  Items Reviewed: Did you receive and understand the discharge instructions provided?: Yes Medications obtained,verified, and reconciled?: Yes (Medications Reviewed) Any new allergies since your discharge?: No Dietary orders reviewed?: NA Do you have support at home?: Yes People in Home: spouse  Medications Reviewed Today: Medications Reviewed Today     Reviewed by Paulita Fujita, Elpidio Anis, CMA (Certified Medical Assistant) on 04/28/23 at 1017  Med List Status: <None>   Medication Order Taking? Sig Documenting Provider Last Dose Status Informant  blood glucose meter kit and supplies KIT [id number] No Dispense based on patient and insurance preference. Use up to four times daily as directed.  Patient not taking: Reported on 04/28/2023   Gerre Scull, NP Not Taking Active   Blood Glucose Monitoring Suppl DEVI [id number] No 1 each by Does not apply route daily. May substitute to any manufacturer covered by patient's insurance.  Patient not taking: Reported on 04/28/2023   Gerre Scull, NP Not Taking Active   Continuous  Glucose Sensor (FREESTYLE LIBRE 3 PLUS SENSOR) MISC [id number] No Change sensor every 15 days.  Patient not taking: Reported on 04/28/2023   Gerre Scull, NP Not Taking Active   diltiazem (CARDIZEM CD) 240 MG 24 hr capsule [id number] No Take 1 capsule (240 mg total) by mouth daily.  Patient not taking: Reported on 04/28/2023   Gerre Scull, NP Not Taking Active   Dulaglutide (TRULICITY) 0.75 MG/0.5ML SOPN [id number] No Inject 0.75 mg into the skin once a week.  Patient not taking: Reported on 04/28/2023   Gerre Scull, NP Not Taking Active   Glucose Blood (BLOOD GLUCOSE TEST STRIPS) STRP [id number] No 1 each by In Vitro route daily. May substitute to any manufacturer covered by patient's insurance.  Patient not taking: Reported on 04/28/2023   Gerre Scull, NP Not Taking Active   ipratropium (ATROVENT) 0.03 % nasal spray [id number] No Place 2 sprays into both nostrils every 12 (twelve) hours.  Patient not taking: Reported on 04/28/2023   Waldon Merl, PA-C Not Taking Active   Lancets [facility] ULTRASOFT) lancets [id number] No Use as instructed  Patient not taking: Reported on 04/28/2023   Gerre Scull, NP Not Taking Active   losartan-hydrochlorothiazide (HYZAAR) 50-12.5 MG tablet [id number] No Take 1 tablet by mouth daily.  Patient not taking: Reported on 04/28/2023   Gerre Scull, NP Not Taking Active   Magnesium Oxide -Mg Supplement 200 MG TABS [id number] No Take 1 tablet (200 mg total) by mouth 2 (two) times daily.  Patient not taking: Reported on 04/28/2023   Gerre Scull, NP Not Taking Active   metFORMIN (GLUCOPHAGE-XR) 500 MG 24 hr tablet [id number] No Take 1 tablet (500 mg total) by mouth in the morning and at bedtime.  Patient not taking: Reported on  04/28/2023   McElwee, Jake Church, NP Not Taking Active   rosuvastatin (CRESTOR) 20 MG tablet [id number] No Take 1 tablet (20 mg total) by mouth daily.  Patient not taking: Reported on 04/28/2023   Gerre Scull,  NP Not Taking Active   traZODone (DESYREL) 50 MG tablet [id number] No Take 0.5-1 tablets (25-50 mg total) by mouth at bedtime as needed for sleep.  Patient not taking: Reported on 04/28/2023   Gerre Scull, NP Not Taking Active   triamcinolone cream (KENALOG) 0.1 % [id number] No Apply 1 Application topically 2 (two) times daily.  Patient not taking: Reported on 04/28/2023   Gerre Scull, NP Not Taking Active             Home Care and Equipment/Supplies: Were Home Health Services Ordered?: No Any new equipment or medical supplies ordered?: No  Functional Questionnaire: Do you need assistance with bathing/showering or dressing?: No Do you need assistance with meal preparation?: No Do you need assistance with eating?: No Do you have difficulty maintaining continence: No Do you need assistance with getting out of bed/getting out of a chair/moving?: No Do you have difficulty managing or taking your medications?: No  Follow up appointments reviewed: PCP Follow-up appointment confirmed?: Yes Date of PCP follow-up appointment?: 04/28/23 Follow-up Provider: Rodman Pickle, NP Specialist Hospital Follow-up appointment confirmed?: NA Do you need transportation to your follow-up appointment?: No Do you understand care options if your condition(s) worsen?: Yes-patient verbalized understanding    SIGNATURE/ BMS

## 2023-04-28 NOTE — Assessment & Plan Note (Signed)
 Hyperlipidemia untreated due to insurance issues. Refill rosuvastatin 20mg  daily.

## 2023-04-28 NOTE — Assessment & Plan Note (Signed)
 Switching diltiazem to Toprol XL 50mg  daily. Follow-up in 2 weeks.

## 2023-04-28 NOTE — Assessment & Plan Note (Signed)
 Check K and Mg levels today.

## 2023-04-28 NOTE — Assessment & Plan Note (Signed)
 Chronic, not controlled. Hypertension with recent severe elevations. Diltiazem discontinued due to hypokalemia. Continue losartan-hctz 50-12.5mg  and start Metoprolol XL 50mg  daily added for heart rate control. Check potassium and magnesium levels. Recheck blood pressure in two weeks.

## 2023-04-28 NOTE — Assessment & Plan Note (Signed)
 Chronic, not controlled. Type 2 Diabetes with recent high glucose levels. Metformin stopped due to side effects. Refill Trulicity 0.75mg  injection weekly. Check insurance for continuous glucose monitor. Check CMP, CBC, A1c today and potentially add SGLT2-I based on results.

## 2023-04-29 NOTE — Telephone Encounter (Signed)
 I called patient and left a message that Nurtec and Tulicity has been approved and to please call her pharmacy to make sure they have it in stock to fill.

## 2023-04-29 NOTE — Telephone Encounter (Signed)
 Please see patient's message below regarding request to change magnesium. I will send message to PA team for [facility]

## 2023-04-29 NOTE — Telephone Encounter (Signed)
 Copied from CRM [id number]. Topic: Clinical - Prescription Issue >> Apr 28, 2023 12:14 PM Paula Robertson wrote: Reason for CRM: Pt called to follow up on Prior authorization for the following medication   Dulaglutide (TRULICITY) 0.75 MG/0.5ML SOAJ [, Continuous Glucose Sensor (FREESTYLE LIBRE 3 PLUS SENSOR) MISC , Rimegepant Sulfate (NURTEC) 75 MG TBDP  Pt states all of the medication requires a prior auth. Please call pt back at [phone]

## 2023-05-06 ENCOUNTER — Other Ambulatory Visit (HOSPITAL_COMMUNITY): Payer: Self-pay

## 2023-05-06 NOTE — Telephone Encounter (Signed)
 Called to check the status of PA   Per the rep, the status is pending. Currently under review.   Case ID: [id number] Phone #[phone] Member ID: [id number]

## 2023-05-07 NOTE — Telephone Encounter (Signed)
 Pharmacy Patient Advocate Encounter  Received notification from [employer] that Prior Authorization for [facility] 3 plus sensor has been DENIED.  See denial reason below. No denial letter attached in CMM. Will attach denial letter to Media tab once received.   PA #/Case ID/Reference #: [id number]

## 2023-05-07 NOTE — Telephone Encounter (Signed)
 I called and spoke with patient and notified her of message. She will pay the $75 out of pocket and will pick up coupon that may help her with the cost.

## 2023-05-10 ENCOUNTER — Other Ambulatory Visit (HOSPITAL_COMMUNITY): Payer: Self-pay

## 2023-05-13 ENCOUNTER — Other Ambulatory Visit (HOSPITAL_COMMUNITY): Payer: Self-pay

## 2023-05-21 ENCOUNTER — Other Ambulatory Visit: Payer: Self-pay | Admitting: Nurse Practitioner

## 2023-05-21 NOTE — Telephone Encounter (Signed)
 Requesting: METOPROLOL SUCC ER 50 MG TAB  Last Visit: 04/28/2023 Next Visit: 06/02/2023 Last Refill: 04/28/2023  Please Advise   Pharmacy comment: REQUEST FOR 90 DAYS PRESCRIPTION.

## 2023-06-02 ENCOUNTER — Ambulatory Visit: Admitting: Nurse Practitioner

## 2023-06-07 ENCOUNTER — Encounter: Payer: Self-pay | Admitting: Nurse Practitioner

## 2023-06-07 MED ORDER — DEXCOM G7 SENSOR MISC: 0 refills | Status: DC

## 2023-06-09 ENCOUNTER — Telehealth: Payer: Self-pay | Admitting: Pharmacy Technician

## 2023-06-09 ENCOUNTER — Other Ambulatory Visit (HOSPITAL_COMMUNITY): Payer: Self-pay

## 2023-06-09 NOTE — Telephone Encounter (Signed)
 Pharmacy Patient Advocate Encounter   Received notification from [employer] that prior authorization for Dexcom G7 Sensor is required/requested.   Insurance verification completed.   The patient is insured through [employer] .   Per test claim: PA required; PA submitted to above mentioned insurance via CoverMyMeds Key/confirmation #/EOC [id number] Status is pending

## 2023-06-11 ENCOUNTER — Other Ambulatory Visit (HOSPITAL_COMMUNITY): Payer: Self-pay

## 2023-06-16 NOTE — Telephone Encounter (Signed)
 Pharmacy Patient Advocate Encounter  Received notification from [employer] that Prior Authorization for Dexcom G7 Sensor has been DENIED.  Full denial letter will be uploaded to the media tab. See denial reason below.    PA #/Case ID/Reference #: [id number]

## 2023-06-16 NOTE — Telephone Encounter (Signed)
 Left message for patient to return call.

## 2023-06-17 MED ORDER — DEXCOM G7 SENSOR MISC: 1 refills | Status: DC

## 2023-06-17 NOTE — Telephone Encounter (Signed)
 I called and spoke with patient and she used the trial voucher and it runs out today. I did notify her that insurance denied authorization for sensor and questions what is the next step.

## 2023-06-17 NOTE — Telephone Encounter (Signed)
 I called and spoke with patient and notified her of below message and she will continue to use her glucometer.

## 2023-06-17 NOTE — Addendum Note (Signed)
 Addended by: Shelvy Heckert A on: 06/17/2023 02:55 PM   Modules accepted: Orders

## 2023-06-18 ENCOUNTER — Ambulatory Visit: Admitting: Nurse Practitioner

## 2023-07-15 ENCOUNTER — Ambulatory Visit: Admitting: Nurse Practitioner

## 2023-07-30 ENCOUNTER — Ambulatory Visit: Admitting: Nurse Practitioner

## 2023-08-23 ENCOUNTER — Other Ambulatory Visit (HOSPITAL_COMMUNITY): Payer: Self-pay

## 2023-09-01 ENCOUNTER — Telehealth: Payer: Self-pay

## 2023-09-01 NOTE — Telephone Encounter (Signed)
 Copied from CRM #[id number]. Topic: General - Other >> Sep 01, 2023 12:59 PM Suzen RAMAN wrote: Reason for CRM: Patient would like to inform provider that her BP has improved

## 2023-09-02 ENCOUNTER — Ambulatory Visit: Admitting: Nurse Practitioner

## 2023-09-02 NOTE — Telephone Encounter (Signed)
 Left message for patient to return call and let us  know what numbers she is getting for her BP.

## 2023-09-03 NOTE — Telephone Encounter (Signed)
 I called and spoke with patient and she said that today her BP was 147/87 and yesterday was 124/82. She said that it has not been in the 200's in a few weeks.

## 2023-09-03 NOTE — Telephone Encounter (Signed)
 I tried to cal patient and phone line is not accepting phone calls at this time. Will try to call back later.

## 2023-09-03 NOTE — Telephone Encounter (Signed)
 Noted

## 2023-09-15 ENCOUNTER — Ambulatory Visit: Admitting: Nurse Practitioner

## 2023-10-12 ENCOUNTER — Other Ambulatory Visit: Payer: Self-pay | Admitting: Nurse Practitioner

## 2023-10-13 NOTE — Telephone Encounter (Signed)
 Requesting: TRULICITY  0.75 MG/0.5 ML PEN  Last Visit: 04/28/2023 Next Visit: Visit date not found Last Refill: 04/28/2023  Please Advise   Patient needs an appointment.

## 2023-10-25 ENCOUNTER — Ambulatory Visit: Admitting: Nurse Practitioner

## 2023-10-25 ENCOUNTER — Encounter: Payer: Self-pay | Admitting: Nurse Practitioner

## 2023-10-25 VITALS — BP 178/100 | HR 67 | Temp 97.4°F | Ht 67.0 in | Wt 180.8 lb

## 2023-10-25 DIAGNOSIS — E1169 Type 2 diabetes mellitus with other specified complication: Secondary | ICD-10-CM

## 2023-10-25 DIAGNOSIS — E785 Hyperlipidemia, unspecified: Secondary | ICD-10-CM

## 2023-10-25 DIAGNOSIS — I152 Hypertension secondary to endocrine disorders: Secondary | ICD-10-CM

## 2023-10-25 DIAGNOSIS — Z1231 Encounter for screening mammogram for malignant neoplasm of breast: Secondary | ICD-10-CM

## 2023-10-25 DIAGNOSIS — G43709 Chronic migraine without aura, not intractable, without status migrainosus: Secondary | ICD-10-CM | POA: Diagnosis not present

## 2023-10-25 DIAGNOSIS — E1159 Type 2 diabetes mellitus with other circulatory complications: Secondary | ICD-10-CM

## 2023-10-25 DIAGNOSIS — M25371 Other instability, right ankle: Secondary | ICD-10-CM | POA: Insufficient documentation

## 2023-10-25 DIAGNOSIS — E1165 Type 2 diabetes mellitus with hyperglycemia: Secondary | ICD-10-CM

## 2023-10-25 DIAGNOSIS — Z7985 Long-term (current) use of injectable non-insulin antidiabetic drugs: Secondary | ICD-10-CM | POA: Insufficient documentation

## 2023-10-25 MED ORDER — TRULICITY 0.75 MG/0.5ML ~~LOC~~ SOAJ: 0.7500 mg | SUBCUTANEOUS | 0 refills | Status: AC

## 2023-10-25 MED ORDER — UBRELVY 100 MG PO TABS: 100.0000 mg | ORAL_TABLET | Freq: Every day | ORAL | 1 refills | Status: AC | PRN

## 2023-10-25 MED ORDER — ROSUVASTATIN CALCIUM 20 MG PO TABS: 20.0000 mg | ORAL_TABLET | Freq: Every day | ORAL | 3 refills | Status: AC

## 2023-10-25 MED ORDER — QULIPTA 60 MG PO TABS: 60.0000 mg | ORAL_TABLET | Freq: Every day | ORAL | 1 refills | Status: AC

## 2023-10-25 MED ORDER — FREESTYLE LIBRE 3 PLUS SENSOR MISC: 3 refills | Status: AC

## 2023-10-25 NOTE — Assessment & Plan Note (Signed)
 Continue trulicity  0.75mg  weekly. Will increase dose if A1c not controlled.

## 2023-10-25 NOTE — Assessment & Plan Note (Signed)
 Chronic, not controlled. She experiences 3-4 episodes weekly, not controlled with Nurtec, and associated with elevated blood pressure. Previous treatments have been ineffective. Potential triggers were discussed. Discontinue Nurtec. Start Qulipta  60mg  daily for migraine prevention. Start Ubrelvy  100mg  at the first sign of a migraine, with a second dose in 2 hours if needed. Encourage identification of personal migraine triggers. She has tried and failed topamax, nurtec, sumatriptan in the past.

## 2023-10-25 NOTE — Assessment & Plan Note (Signed)
 Chronic, ongoing. She faces challenges with glucose monitoring due to insurance and equipment issues. Trulicity  was resumed after a temporary discontinuation. Weight loss has been noted, which is beneficial for diabetes management. Order a [employer] continuous glucose monitor. Continue Trulicity  0.75 mg weekly. Check CMP, CBC, A1c levels. Encourage weight loss and monitor progress.

## 2023-10-25 NOTE — Assessment & Plan Note (Signed)
 She has chronic instability with episodes of giving out, without pain or swelling, due to weak tendons. Recommend daily ankle exercises, including spelling the alphabet with the foot, to strengthen tendons.

## 2023-10-25 NOTE — Assessment & Plan Note (Signed)
 Chronic, ongoing. Episodic elevation occurs during migraines. Metoprolol  XL 50mg  daily is continued, and losartan -hctz 50-12.5mg  daily is recommended for renal protection due to diabetes. She thought she was supposed to stop losartan  and start metoprolol . Recommend she take both as blood pressure elevated today (most likely from migraine), but still elevated in the 140s at home. Check CMP, CBC today.

## 2023-10-25 NOTE — Progress Notes (Signed)
 Established Patient Office Visit  Subjective   Patient ID: Paula Robertson, female    DOB: 08-Mar-1980  Age: 43 y.o. MRN: [id number]  Chief Complaint  Patient presents with   Migraine    Concerns with medication not helping with migraines, right ankle giving out, stomach crams from medication, request a continuous glucose meter    HPI Discussed the use of AI scribe software for clinical note transcription with the patient, who gave verbal consent to proceed.  History of Present Illness   Paula Robertson is a 43 year old female with diabetes and hypertension who presents for management of her blood sugar and migraine headaches.  She struggles with blood sugar management due to a malfunctioning glucose meter and attempts to check levels at work when feeling unwell. She uses Trulicity  weekly, which she had temporarily stopped due to insurance issues but has resumed. She seeks a continuous glucose monitor but faces insurance denials. Stress eating is a concern, and she is trying to be more conscious of her diet. She is not taking her cholesterol medication due to prescription issues and is currently on metoprolol  for hypertension.  She experiences migraine headaches three to four times a week. She has tried beta blockers, Topamax, sumatriptan, and Nurtec with limited relief. Migraines often elevate her blood pressure. Severe headaches are accompanied by neck pain but no nausea.  Her right ankle frequently gives out, leading to tripping and falling without significant pain or swelling. She experiences stomach cramping, particularly after Trulicity  injections, with loud gurgling noises and acknowledges inadequate water intake. This goes away after a day.       ROS See pertinent positives and negatives per HPI.    Objective:     BP (!) 178/100 (BP Location: Left Arm, Cuff Size: Large)   Pulse 67   Temp (!) 97.4 F (36.3 C)   Ht 5' 7 (1.702 m)   Wt 180 lb 12.8 oz (82 kg)   LMP  10/23/2023 (Exact Date)   SpO2 99%   BMI 28.32 kg/m  BP Readings from Last 3 Encounters:  10/25/23 (!) 178/100  04/28/23 (!) 174/104  04/23/23 (!) 144/78   Wt Readings from Last 3 Encounters:  10/25/23 180 lb 12.8 oz (82 kg)  04/28/23 186 lb 3.2 oz (84.5 kg)  04/22/23 186 lb (84.4 kg)      Physical Exam Vitals and nursing note reviewed.  Constitutional:      General: She is not in acute distress.    Appearance: Normal appearance.  HENT:     Head: Normocephalic.  Eyes:     Conjunctiva/sclera: Conjunctivae normal.  Cardiovascular:     Rate and Rhythm: Normal rate and regular rhythm.     Pulses: Normal pulses.     Heart sounds: Normal heart sounds.  Pulmonary:     Effort: Pulmonary effort is normal.     Breath sounds: Normal breath sounds.  Musculoskeletal:        General: No swelling or tenderness. Normal range of motion.     Cervical back: Normal range of motion.  Skin:    General: Skin is warm.  Neurological:     General: No focal deficit present.     Mental Status: She is alert and oriented to person, place, and time.  Psychiatric:        Mood and Affect: Mood normal.        Behavior: Behavior normal.        Thought Content: Thought content normal.  Judgment: Judgment normal.    The 10-year ASCVD risk score (Arnett DK, et al., 2019) is: 26.5%    Assessment & Plan:   Problem List Items Addressed This Visit       Cardiovascular and Mediastinum   Hypertension associated with diabetes (HCC)   Chronic, ongoing. Episodic elevation occurs during migraines. Metoprolol  XL 50mg  daily is continued, and losartan -hctz 50-12.5mg  daily is recommended for renal protection due to diabetes. She thought she was supposed to stop losartan  and start metoprolol . Recommend she take both as blood pressure elevated today (most likely from migraine), but still elevated in the 140s at home. Check CMP, CBC today.       Relevant Medications   Dulaglutide  (TRULICITY ) 0.75  MG/0.5ML SOAJ   rosuvastatin  (CRESTOR ) 20 MG tablet   Other Relevant Orders   CBC with Differential/Platelet   Comprehensive metabolic panel with GFR   Chronic migraine without aura without status migrainosus, not intractable   Chronic, not controlled. She experiences 3-4 episodes weekly, not controlled with Nurtec, and associated with elevated blood pressure. Previous treatments have been ineffective. Potential triggers were discussed. Discontinue Nurtec. Start Qulipta  60mg  daily for migraine prevention. Start Ubrelvy  100mg  at the first sign of a migraine, with a second dose in 2 hours if needed. Encourage identification of personal migraine triggers. She has tried and failed topamax, nurtec, sumatriptan in the past.       Relevant Medications   Atogepant  (QULIPTA ) 60 MG TABS   Ubrogepant  (UBRELVY ) 100 MG TABS   rosuvastatin  (CRESTOR ) 20 MG tablet     Endocrine   Type 2 diabetes mellitus with hyperglycemia, without long-term current use of insulin (HCC) - Primary   Chronic, ongoing. She faces challenges with glucose monitoring due to insurance and equipment issues. Trulicity  was resumed after a temporary discontinuation. Weight loss has been noted, which is beneficial for diabetes management. Order a [employer] continuous glucose monitor. Continue Trulicity  0.75 mg weekly. Check CMP, CBC, A1c levels. Encourage weight loss and monitor progress.      Relevant Medications   Dulaglutide  (TRULICITY ) 0.75 MG/0.5ML SOAJ   rosuvastatin  (CRESTOR ) 20 MG tablet   Other Relevant Orders   CBC with Differential/Platelet   Comprehensive metabolic panel with GFR   Hemoglobin A1c   Hyperlipidemia associated with type 2 diabetes mellitus (HCC)   Chronic, ongoing. She has issues with medication refills and is not currently on cholesterol medication. Will resent rosuvastatin  20mg  daily. Check CMP, CBC, lipid panel today.       Relevant Medications   Dulaglutide  (TRULICITY ) 0.75 MG/0.5ML SOAJ    rosuvastatin  (CRESTOR ) 20 MG tablet   Other Relevant Orders   CBC with Differential/Platelet   Comprehensive metabolic panel with GFR   Lipid panel     Other   Instability of right ankle joint   She has chronic instability with episodes of giving out, without pain or swelling, due to weak tendons. Recommend daily ankle exercises, including spelling the alphabet with the foot, to strengthen tendons.      Long-term current use of injectable noninsulin antidiabetic medication   Continue trulicity  0.75mg  weekly. Will increase dose if A1c not controlled.       Other Visit Diagnoses       Encounter for screening mammogram for malignant neoplasm of breast       Mammogram scheduled on mobile mammogram bus   Relevant Orders   MM 3D SCREENING MAMMOGRAM BILATERAL BREAST       Return in about 3 months (around 01/24/2024)  for CPE.    Tinnie DELENA Harada, NP

## 2023-10-25 NOTE — Patient Instructions (Signed)
 It was great to see you!  We are checking your labs today and will let you know the results via mychart/phone.   Stop nurtec  Start qulipita 1  tablet daily to prevent migraines  Start ubrelvy  1 tablet daily as needed at the first sign of a migraine   Keep checking your blood sugar at home  I have ordered the freestyle libre continuous glucose monitor   Let's follow-up in 3 months, sooner if you have concerns.  If a referral was placed today, you will be contacted for an appointment. Please note that routine referrals can sometimes take up to 3-4 weeks to process. Please call our office if you haven't heard anything after this time frame.  Take care,  Tinnie Harada, NP

## 2023-10-25 NOTE — Assessment & Plan Note (Signed)
 Chronic, ongoing. She has issues with medication refills and is not currently on cholesterol medication. Will resent rosuvastatin  20mg  daily. Check CMP, CBC, lipid panel today.

## 2023-10-26 LAB — LIPID PANEL
Cholesterol: 169 mg/dL (ref 0–200)
HDL: 50.1 mg/dL (ref >39.00)
LDL Cholesterol: 106 mg/dL — ABNORMAL HIGH (ref 0–99)
NonHDL: 118.9
Total CHOL/HDL Ratio: 3
Triglycerides: 65 mg/dL (ref 0.0–149.0)
VLDL: 13 mg/dL (ref 0.0–40.0)

## 2023-10-26 LAB — CBC WITH DIFFERENTIAL/PLATELET
Basophils Absolute: 0.1 K/uL (ref 0.0–0.1)
Basophils Relative: 0.9 % (ref 0.0–3.0)
Eosinophils Absolute: 0.2 K/uL (ref 0.0–0.7)
Eosinophils Relative: 2.7 % (ref 0.0–5.0)
HCT: 35.5 % — ABNORMAL LOW (ref 36.0–46.0)
Hemoglobin: 11.7 g/dL — ABNORMAL LOW (ref 12.0–15.0)
Lymphocytes Relative: 38.5 % (ref 12.0–46.0)
Lymphs Abs: 2.3 K/uL (ref 0.7–4.0)
MCHC: 32.9 g/dL (ref 30.0–36.0)
MCV: 87.7 fl (ref 78.0–100.0)
Monocytes Absolute: 0.5 K/uL (ref 0.1–1.0)
Monocytes Relative: 7.9 % (ref 3.0–12.0)
Neutro Abs: 3 K/uL (ref 1.4–7.7)
Neutrophils Relative %: 50 % (ref 43.0–77.0)
Platelets: 321 K/uL (ref 150.0–400.0)
RBC: 4.04 Mil/uL (ref 3.87–5.11)
RDW: 13.3 % (ref 11.5–15.5)
WBC: 6 K/uL (ref 4.0–10.5)

## 2023-10-26 LAB — COMPREHENSIVE METABOLIC PANEL WITH GFR
ALT: 7 U/L (ref 0–35)
AST: 13 U/L (ref 0–37)
Albumin: 4.1 g/dL (ref 3.5–5.2)
Alkaline Phosphatase: 69 U/L (ref 39–117)
BUN: 12 mg/dL (ref 6–23)
CO2: 29 meq/L (ref 19–32)
Calcium: 9.4 mg/dL (ref 8.4–10.5)
Chloride: 104 meq/L (ref 96–112)
Creatinine, Ser: 0.85 mg/dL (ref 0.40–1.20)
GFR: 85.49 mL/min (ref >60.00)
Glucose, Bld: 112 mg/dL — ABNORMAL HIGH (ref 70–99)
Potassium: 3.6 meq/L (ref 3.5–5.1)
Sodium: 141 meq/L (ref 135–145)
Total Bilirubin: 0.4 mg/dL (ref 0.2–1.2)
Total Protein: 6.7 g/dL (ref 6.0–8.3)

## 2023-10-26 LAB — HEMOGLOBIN A1C: Hgb A1c MFr Bld: 6.4 % (ref 4.6–6.5)

## 2023-10-27 ENCOUNTER — Telehealth: Payer: Self-pay

## 2023-10-27 NOTE — Telephone Encounter (Signed)
 Can we get a prior auth on Ubrogepant  (UBRELVY ) 100 MG TAB and Atogepant  (QULIPTA ) 60 MG TABS?

## 2023-10-27 NOTE — Telephone Encounter (Signed)
 Copied from CRM [id number]. Topic: Clinical - Lab/Test Results >> Oct 27, 2023  3:12 PM Chasity T wrote: Reason for CRM: Patient is calling to go over recent lab results. Please contact back once reviewed.

## 2023-10-27 NOTE — Telephone Encounter (Signed)
 I called and spoke with patient and notified her that the physician has not had a chance to elaborate on her test results and apologized for the delay.

## 2023-10-27 NOTE — Telephone Encounter (Signed)
 Copied from CRM [id number]. Topic: Clinical - Medication Prior Auth >> Oct 27, 2023  3:14 PM Chasity T wrote: Reason for CRM: Patient is needing dr prior shara for 2 medications to be sent over  Ubrogepant  (UBRELVY ) 100 MG TABS Atogepant  (QULIPTA ) 60 MG TABS

## 2023-10-28 ENCOUNTER — Telehealth: Payer: Self-pay

## 2023-10-28 ENCOUNTER — Other Ambulatory Visit (HOSPITAL_COMMUNITY): Payer: Self-pay

## 2023-10-28 NOTE — Telephone Encounter (Signed)
 Pharmacy Patient Advocate Encounter  Received notification from [facility] that Prior Authorization for Ubrelvy  100MG  tablets  has been APPROVED from 8.19.25 to 9.18.26. Ran test claim, Copay is $0.00. This test claim was processed through [facility]- copay amounts may vary at other pharmacies due to pharmacy/plan contracts, or as the patient moves through the different stages of their insurance plan.   PA #/Case ID/Reference #: [facility]

## 2023-10-28 NOTE — Telephone Encounter (Signed)
 Patient notified via Mychart in next phone encounter. Awaiting prior auth of Atogepant  (QULIPTA ) 60 MG TABS.

## 2023-10-29 ENCOUNTER — Telehealth: Payer: Self-pay

## 2023-10-29 ENCOUNTER — Ambulatory Visit: Payer: Self-pay | Admitting: Family

## 2023-10-29 ENCOUNTER — Other Ambulatory Visit (HOSPITAL_COMMUNITY): Payer: Self-pay

## 2023-10-29 NOTE — Telephone Encounter (Signed)
 Pharmacy Patient Advocate Encounter   Received notification from Pt Calls Messages that prior authorization for Qulipta  60MG  tablets  is required/requested.   Insurance verification completed.   The patient is insured through [employer] .   Per test claim: PA required; PA submitted to above mentioned insurance via Latent Key/confirmation #/EOC [id number] Status is pending

## 2023-10-29 NOTE — Telephone Encounter (Signed)
 Patient notified via Mychart in phone encounter for Qulipta  approval.

## 2023-10-29 NOTE — Telephone Encounter (Signed)
 Pharmacy Patient Advocate Encounter  Received notification from [facility] that Prior Authorization for Qulipta  60MG  tablets has been APPROVED from 8.20.25 to 9.19.26. Ran test claim, . This test claim was processed through [facility]- copay amounts may vary at other pharmacies due to pharmacy/plan contracts, or as the patient moves through the different stages of their insurance plan.   PA #/Case ID/Reference #: [id number]

## 2023-11-01 NOTE — Telephone Encounter (Signed)
 Copied from CRM #[id number]. Topic: General - Other >> Nov 01, 2023  9:11 AM Paula Robertson wrote: Reason for CRM: Patient called in regarding a missed call from [place], would like a callback

## 2023-11-01 NOTE — Telephone Encounter (Signed)
 Patient has viewed her lab results and questions if there is anything she needs to do regarding her hemoglobin and HCT being low?

## 2023-12-27 ENCOUNTER — Encounter

## 2024-01-26 ENCOUNTER — Encounter: Admitting: Nurse Practitioner

## 2024-03-22 ENCOUNTER — Ambulatory Visit: Admitting: Nurse Practitioner
